# Patient Record
Sex: Male | Born: 1985
Health system: Southern US, Community
[De-identification: ages and names within clinical notes are randomized; demographics above are authoritative.]

## PROBLEM LIST (undated history)

## (undated) DIAGNOSIS — J020 Streptococcal pharyngitis: Secondary | ICD-10-CM

## (undated) DIAGNOSIS — J302 Other seasonal allergic rhinitis: Secondary | ICD-10-CM

## (undated) HISTORY — DX: Streptococcal pharyngitis: J02.0

## (undated) HISTORY — DX: Morbid (severe) obesity due to excess calories: E66.01

## (undated) HISTORY — DX: Other seasonal allergic rhinitis: J30.2

## (undated) HISTORY — PX: NO PAST SURGERIES: SHX2092

---

## 2011-04-28 ENCOUNTER — Emergency Department (INDEPENDENT_AMBULATORY_CARE_PROVIDER_SITE_OTHER)
Admission: EM | Admit: 2011-04-28 | Discharge: 2011-04-28 | Disposition: A | Discharge status: Home / Self Care | Payer: 59 | Source: Home / Self Care | Attending: Family Medicine | Admitting: Family Medicine

## 2011-04-28 ENCOUNTER — Encounter: Payer: Self-pay | Admitting: Emergency Medicine

## 2011-04-28 DIAGNOSIS — R6889 Other general symptoms and signs: Secondary | ICD-10-CM

## 2011-04-28 DIAGNOSIS — J111 Influenza due to unidentified influenza virus with other respiratory manifestations: Secondary | ICD-10-CM

## 2011-04-28 MED ORDER — ACETAMINOPHEN 325 MG PO TABS
ORAL_TABLET | ORAL | Status: AC
  Filled 2011-04-28: qty 3

## 2011-04-28 MED ORDER — OSELTAMIVIR PHOSPHATE 75 MG PO CAPS: 75.0000 mg | ORAL_CAPSULE | Freq: Two times a day (BID) | ORAL | Status: AC

## 2011-04-28 MED ORDER — ACETAMINOPHEN 325 MG PO TABS
975.0000 mg | ORAL_TABLET | Freq: Once | ORAL | Status: AC
  Administered 2011-04-28: 975 mg via ORAL

## 2011-04-28 NOTE — ED Provider Notes (Signed)
History     CSN: [id number]  Arrival date & time 04/28/11  1629   First MD Initiated Contact with Patient 04/28/11 1638      Chief Complaint  Patient presents with  . Influenza    (Consider location/radiation/quality/duration/timing/severity/associated sxs/prior treatment) HPI Comments: Victor Zimmerman presents for evaluation of headache, fever 101.2 F, body aches, rapid heart rate that started last evening.   Patient is a 26 y.o. male presenting with flu symptoms. The history is provided by the patient.  Influenza This is a new problem. The current episode started 12 to 24 hours ago. The problem occurs constantly. The problem has not changed since onset.Associated symptoms include headaches. Pertinent negatives include no chest pain. The symptoms are aggravated by nothing. The symptoms are relieved by nothing. He has tried acetaminophen for the symptoms.    History reviewed. No pertinent past medical history.  History reviewed. No pertinent past surgical history.  No family history on file.  History  Substance Use Topics  . Smoking status: Never Smoker   . Smokeless tobacco: Not on file  . Alcohol Use: No      Review of Systems  Constitutional: Positive for fever, chills and fatigue.  Eyes: Negative.   Respiratory: Negative.   Cardiovascular: Negative.  Negative for chest pain.  Gastrointestinal: Negative.   Genitourinary: Negative.   Musculoskeletal: Positive for myalgias.  Skin: Negative.   Neurological: Positive for headaches.    Allergies  Review of patient's allergies indicates no known allergies.  Home Medications   Current Outpatient Rx  Name Route Sig Dispense Refill  . OSELTAMIVIR PHOSPHATE 75 MG PO CAPS Oral Take 1 capsule (75 mg total) by mouth every 12 (twelve) hours. 10 capsule 0    BP 111/55  Pulse 130  Temp(Src) 100.6 F (38.1 C) (Oral)  Resp 24  SpO2 97%  Physical Exam  Nursing note and vitals reviewed. Constitutional: He is oriented to  person, place, and time. He appears well-developed and well-nourished.  HENT:  Head: Normocephalic and atraumatic.  Right Ear: Tympanic membrane is retracted.  Left Ear: Tympanic membrane is retracted.  Mouth/Throat: Uvula is midline, oropharynx is clear and moist and mucous membranes are normal.  Eyes: EOM are normal.  Neck: Normal range of motion.  Cardiovascular: Regular rhythm and normal heart sounds.  Tachycardia present.   No murmur heard. Pulmonary/Chest: Effort normal and breath sounds normal. He has no wheezes. He has no rhonchi.  Musculoskeletal: Normal range of motion.  Neurological: He is alert and oriented to person, place, and time.  Skin: Skin is warm and dry.  Psychiatric: His behavior is normal.    ED Course  Procedures (including critical care time)  Labs Reviewed - No data to display No results found.   1. Influenza-like illness       MDM  Fever and headache responded to acetaminophen 975 mg       Richardo Priest, MD 04/28/11 2105
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
## 2011-06-27 ENCOUNTER — Emergency Department (HOSPITAL_COMMUNITY)
Admission: EM | Admit: 2011-06-27 | Discharge: 2011-06-27 | Disposition: A | Discharge status: Home / Self Care | Payer: 59 | Source: Home / Self Care

## 2011-06-27 ENCOUNTER — Encounter (HOSPITAL_COMMUNITY): Payer: Self-pay | Admitting: *Deleted

## 2011-06-27 ENCOUNTER — Emergency Department (INDEPENDENT_AMBULATORY_CARE_PROVIDER_SITE_OTHER)
Admission: EM | Admit: 2011-06-27 | Discharge: 2011-06-27 | Disposition: A | Discharge status: Home / Self Care | Payer: 59 | Source: Home / Self Care

## 2011-06-27 DIAGNOSIS — T148XXA Other injury of unspecified body region, initial encounter: Secondary | ICD-10-CM

## 2011-06-27 MED ORDER — IBUPROFEN 800 MG PO TABS: 800.0000 mg | ORAL_TABLET | Freq: Three times a day (TID) | ORAL | Status: AC

## 2011-06-27 MED ORDER — CYCLOBENZAPRINE HCL 5 MG PO TABS: 5.0000 mg | ORAL_TABLET | Freq: Three times a day (TID) | ORAL | Status: AC | PRN
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
## 2011-06-27 NOTE — ED Notes (Signed)
Pt called x 2, in lobby

## 2011-06-30 NOTE — ED Provider Notes (Signed)
Medical screening examination/treatment/procedure(s) were performed by non-physician practitioner and as supervising physician I was immediately available for consultation/collaboration.  Alen Bleacher, MD 06/30/11 2051

## 2012-07-13 ENCOUNTER — Emergency Department (HOSPITAL_COMMUNITY): Payer: BC Managed Care – PPO

## 2012-07-13 ENCOUNTER — Emergency Department (HOSPITAL_COMMUNITY)
Admission: EM | Admit: 2012-07-13 | Discharge: 2012-07-13 | Disposition: A | Discharge status: Home / Self Care | Payer: BC Managed Care – PPO | Attending: Emergency Medicine | Admitting: Emergency Medicine

## 2012-07-13 ENCOUNTER — Encounter (HOSPITAL_COMMUNITY): Payer: Self-pay | Admitting: *Deleted

## 2012-07-13 DIAGNOSIS — Z79899 Other long term (current) drug therapy: Secondary | ICD-10-CM | POA: Insufficient documentation

## 2012-07-13 DIAGNOSIS — R1310 Dysphagia, unspecified: Secondary | ICD-10-CM | POA: Insufficient documentation

## 2012-07-13 DIAGNOSIS — R51 Headache: Secondary | ICD-10-CM | POA: Insufficient documentation

## 2012-07-13 DIAGNOSIS — M542 Cervicalgia: Secondary | ICD-10-CM | POA: Insufficient documentation

## 2012-07-13 DIAGNOSIS — R61 Generalized hyperhidrosis: Secondary | ICD-10-CM | POA: Insufficient documentation

## 2012-07-13 DIAGNOSIS — J020 Streptococcal pharyngitis: Secondary | ICD-10-CM | POA: Insufficient documentation

## 2012-07-13 DIAGNOSIS — R509 Fever, unspecified: Secondary | ICD-10-CM | POA: Insufficient documentation

## 2012-07-13 DIAGNOSIS — J0300 Acute streptococcal tonsillitis, unspecified: Secondary | ICD-10-CM

## 2012-07-13 DIAGNOSIS — R498 Other voice and resonance disorders: Secondary | ICD-10-CM | POA: Insufficient documentation

## 2012-07-13 MED ORDER — HYDROCODONE-ACETAMINOPHEN 7.5-500 MG/15ML PO SOLN: 15.0000 mL | Freq: Four times a day (QID) | ORAL | Status: DC | PRN

## 2012-07-13 MED ORDER — KETOROLAC TROMETHAMINE 30 MG/ML IJ SOLN
30.0000 mg | Freq: Once | INTRAMUSCULAR | Status: AC
  Administered 2012-07-13: 30 mg via INTRAVENOUS
  Filled 2012-07-13: qty 1

## 2012-07-13 MED ORDER — MORPHINE SULFATE 4 MG/ML IJ SOLN
6.0000 mg | Freq: Once | INTRAMUSCULAR | Status: AC
  Administered 2012-07-13: 6 mg via INTRAVENOUS
  Filled 2012-07-13: qty 2

## 2012-07-13 MED ORDER — DEXAMETHASONE SODIUM PHOSPHATE 4 MG/ML IJ SOLN
4.0000 mg | Freq: Once | INTRAMUSCULAR | Status: AC
  Administered 2012-07-13: 4 mg via INTRAVENOUS
  Filled 2012-07-13: qty 1

## 2012-07-13 MED ORDER — HYDROCODONE-ACETAMINOPHEN 7.5-325 MG/15ML PO SOLN
10.0000 mL | Freq: Once | ORAL | Status: AC
  Administered 2012-07-13: 10 mL via ORAL
  Filled 2012-07-13: qty 15

## 2012-07-13 MED ORDER — SODIUM CHLORIDE 0.9 % IV SOLN: Freq: Once | INTRAVENOUS | Status: DC

## 2012-07-13 MED ORDER — SODIUM CHLORIDE 0.9 % IV BOLUS (SEPSIS)
1000.0000 mL | Freq: Once | INTRAVENOUS | Status: AC
  Administered 2012-07-13: 1000 mL via INTRAVENOUS

## 2012-07-13 MED ORDER — PENICILLIN G BENZATHINE 1200000 UNIT/2ML IM SUSP
1.2000 10*6.[IU] | Freq: Once | INTRAMUSCULAR | Status: AC
  Administered 2012-07-13: 1.2 10*6.[IU] via INTRAMUSCULAR
  Filled 2012-07-13: qty 2

## 2012-07-13 MED ORDER — HYDROMORPHONE HCL PF 1 MG/ML IJ SOLN
1.0000 mg | Freq: Once | INTRAMUSCULAR | Status: AC
  Administered 2012-07-13: 1 mg via INTRAVENOUS
  Filled 2012-07-13: qty 1

## 2012-07-13 MED ORDER — PREDNISONE 20 MG PO TABS: 40.0000 mg | ORAL_TABLET | Freq: Every day | ORAL | Status: DC

## 2012-07-13 MED ORDER — IOHEXOL 300 MG/ML  SOLN
80.0000 mL | Freq: Once | INTRAMUSCULAR | Status: AC | PRN
  Administered 2012-07-13: 80 mL via INTRAVENOUS

## 2012-07-13 MED ORDER — ONDANSETRON HCL 4 MG/2ML IJ SOLN
4.0000 mg | Freq: Once | INTRAMUSCULAR | Status: AC
  Administered 2012-07-13: 4 mg via INTRAVENOUS
  Filled 2012-07-13: qty 2

## 2012-07-13 NOTE — ED Notes (Signed)
Patient presents with swollen throat x 3. Went to [facility] yesterday. Dx strep throat. Rx PCN. Within the past 24 hours patient developed more pain in throat, pain & difficulty swallowing and unable to speak (whispers). No fevers. Diaphoresis. Taking tylenol for pain.

## 2012-07-13 NOTE — ED Notes (Signed)
Patient transported to CT 

## 2012-07-13 NOTE — ED Notes (Signed)
ED PA at bedside

## 2012-07-13 NOTE — ED Notes (Signed)
The pt was diagnosed with strep throat at a clinic yesterday in [place].  He was given penicillin he has had  4 doses.  Today he has had more difficulty swallowing and when hew swallows he feels like knives in his throat.   He has no rash no difficulty breathing except when he swallows.  His tonsils are swollen but his airway is not obstructed

## 2012-07-13 NOTE — ED Provider Notes (Signed)
History     CSN: [id number]  Arrival date & time 07/13/12  1700   First MD Initiated Contact with Patient 07/13/12 1742      Chief Complaint  Patient presents with  . swollen tonsils     (Consider location/radiation/quality/duration/timing/severity/associated sxs/prior treatment) HPI Victor Zimmerman is a 27 y.o. male who presents with complaint of sore throat, swollen tonsils, unable to swallow solids or speak. States that pain for about 2 days, went to urgent care yesterday, had positive strep screen. Was started on penicillin. Had 4 doses so far. Today pain worsened. States unable to swallow solids, pain with drinking fluids. Unable to speak. States feels like throat is swelling. No difficulty breathing. Fevers at home, taking tylenol and motrin for this, last dose around 4 hrs ago. States also was given a lidocaine mouth wash, which is not helping.    History reviewed. No pertinent past medical history.  History reviewed. No pertinent past surgical history.  No family history on file.  History  Substance Use Topics  . Smoking status: Never Smoker   . Smokeless tobacco: Not on file  . Alcohol Use: No      Review of Systems  Constitutional: Positive for fever, chills and diaphoresis.  HENT: Positive for sore throat, trouble swallowing, neck pain and voice change. Negative for neck stiffness.   Respiratory: Negative.   Cardiovascular: Negative.   Neurological: Positive for headaches.  All other systems reviewed and are negative.    Allergies  Review of patient's allergies indicates no known allergies.  Home Medications   Current Outpatient Rx  Name  Route  Sig  Dispense  Refill  . acetaminophen (TYLENOL) 500 MG tablet   Oral   Take 1,000 mg by mouth every 6 (six) hours as needed for pain.         Marland Kitchen ibuprofen (ADVIL,MOTRIN) 200 MG tablet   Oral   Take 800 mg by mouth every 6 (six) hours as needed for pain.         Marland Kitchen penicillin v potassium (VEETID) 500 MG  tablet   Oral   Take 500 mg by mouth 2 (two) times daily.           BP 131/68  Pulse 103  Temp(Src) 98.4 F (36.9 C) (Oral)  Resp 18  SpO2 95%  Physical Exam  Nursing note and vitals reviewed. Constitutional: He appears well-developed and well-nourished.  Uncomfortable appearing, lip talking  HENT:  Head: Normocephalic.  Right Ear: Tympanic membrane, external ear and ear canal normal.  Left Ear: Tympanic membrane, external ear and ear canal normal.  Nose: Nose normal.  Mouth/Throat: Uvula is midline and mucous membranes are normal. Normal dentition. Oropharyngeal exudate, posterior oropharyngeal edema and posterior oropharyngeal erythema present. No tonsillar abscesses.  Tonsils enlarged, exudate present  Eyes: Conjunctivae are normal.  Neck: Neck supple.  Cardiovascular: Normal rate, regular rhythm and normal heart sounds.   Pulmonary/Chest: Effort normal and breath sounds normal. No respiratory distress. He has no wheezes. He has no rales.  No stridor  Lymphadenopathy:    He has no cervical adenopathy.  Neurological: He is alert.  Skin: He is diaphoretic.    ED Course  Procedures (including critical care time)  No results found for this or any previous visit. Ct Soft Tissue Neck W Contrast  07/13/2012  *RADIOLOGY REPORT*  Clinical Data: Sore throat and neck pain.  CT NECK WITH CONTRAST  Technique:  Multidetector CT imaging of the neck was performed with intravenous contrast.  Contrast: 80mL OMNIPAQUE IOHEXOL 300 MG/ML  SOLN  Comparison: None.  Findings: The visualized portion of the brain is unremarkable.  The major vascular structures are normal.  The skull base is unremarkable.  The visualized paranasal sinuses and mastoid air cells are clear.  The middle ear cavities are clear.  The parotid and submandibular glands are normal.  There are bilateral enlarged neck nodes likely due to inflammation.  There is marked inflammation and swelling of the tonsils and adenoids.  I  do not see a discrete abscess. Multiple calcifications are noted in the tonsillar regions likely due to prior inflammatory disease.  The epiglottis and aryepiglottic folds are normal.  No abnormal prevertebral or retropharyngeal soft tissue swelling or air collections.  The thyroid gland is normal.  The major vascular structures are normal.  The tongue base and floor of mouth are unremarkable.  The lung apices are clear.  IMPRESSION:  1.  Severe tonsillitis but no discrete tonsillar abscess. 2.  Inflammatory neck adenopathy.   Original Report Authenticated By: Rudie Meyer, M.D.      1. Streptococcal tonsillitis       MDM  Pt with bilaterally enlarged tonsils, pain with talking, muffled voice. CT obtained of the soft tissue neck, showing severe tonsillitis. Pt was giving bicillin shot in ED. Decadron 4mg  IV. He was given fluids, morphin and dilaudid for pain. Pt is tolerating po fluids. No respiratory problems. Pt will be d/c home with pain medications and follow up.   Filed Vitals:   07/13/12 1830 07/13/12 1930 07/13/12 2000 07/13/12 2030  BP: 135/96 126/77 124/73 135/80  Pulse: 91 92 82 87  Temp:      TempSrc:      Resp:      SpO2: 97% 97% 98% 94%           Myriam Jacobson Gordana Kewley, PA-C 07/14/12 0037

## 2012-07-14 NOTE — ED Provider Notes (Signed)
Medical screening examination/treatment/procedure(s) were performed by non-physician practitioner and as supervising physician I was immediately available for consultation/collaboration.  Tunisia Landgrebe, MD 07/14/12 0820 

## 2013-03-21 ENCOUNTER — Emergency Department (HOSPITAL_COMMUNITY)
Admission: EM | Admit: 2013-03-21 | Discharge: 2013-03-21 | Disposition: A | Discharge status: Home / Self Care | Payer: BC Managed Care – PPO | Source: Home / Self Care | Attending: Family Medicine | Admitting: Family Medicine

## 2013-03-21 ENCOUNTER — Encounter (HOSPITAL_COMMUNITY): Payer: Self-pay | Admitting: Emergency Medicine

## 2013-03-21 DIAGNOSIS — J329 Chronic sinusitis, unspecified: Secondary | ICD-10-CM

## 2013-03-21 MED ORDER — PREDNISONE 50 MG PO TABS: 50.0000 mg | ORAL_TABLET | Freq: Every day | ORAL | Status: DC

## 2013-03-21 MED ORDER — IPRATROPIUM BROMIDE 0.06 % NA SOLN: 2.0000 | Freq: Four times a day (QID) | NASAL | Status: DC

## 2013-03-21 MED ORDER — AZITHROMYCIN 250 MG PO TABS: 250.0000 mg | ORAL_TABLET | Freq: Every day | ORAL | Status: DC

## 2013-03-21 NOTE — ED Provider Notes (Signed)
Victor Zimmerman is a 27 y.o. male who presents to Urgent Care today for 9 days of facial pain nasal discharge chills nasal congestion and difficulty sleeping. He's tried Tylenol ibuprofen and Mucinex which has helped only a little. He notes that warm showers allow his nose to drain which helps. He has a history of frequent sinus infections. His current symptoms are consistent with sinus infection. He feels well with no nausea vomiting or diarrhea.  In the past azithromycin typically works well for his sinus infections.    History reviewed. No pertinent past medical history. History  Substance Use Topics  . Smoking status: Never Smoker   . Smokeless tobacco: Not on file  . Alcohol Use: No   ROS as above Medications reviewed. No current facility-administered medications for this encounter.   Current Outpatient Prescriptions  Medication Sig Dispense Refill  . acetaminophen (TYLENOL) 500 MG tablet Take 1,000 mg by mouth every 6 (six) hours as needed for pain.      Marland Kitchen azithromycin (ZITHROMAX) 250 MG tablet Take 1 tablet (250 mg total) by mouth daily. Take first 2 tablets together, then 1 every day until finished.  6 tablet  0  . ibuprofen (ADVIL,MOTRIN) 200 MG tablet Take 800 mg by mouth every 6 (six) hours as needed for pain.      Marland Kitchen ipratropium (ATROVENT) 0.06 % nasal spray Place 2 sprays into both nostrils 4 (four) times daily.  15 mL  1  . predniSONE (DELTASONE) 50 MG tablet Take 1 tablet (50 mg total) by mouth daily.  5 tablet  0    Exam:  BP 118/79  Pulse 76  Temp(Src) 98 F (36.7 C) (Oral)  Resp 20  SpO2 100% Gen: Well NAD HEENT: EOMI,  MMM tender palpation bilateral maxillary sinus. Well-appearing tympanic membranes bilaterally. Posterior pharynx mildly erythematous Lungs: Normal work of breathing. CTABL Heart: RRR no MRG Abd: NABS, Soft. NT, ND Exts: Non edematous BL  LE, warm and well perfused.    Assessment and Plan: 27 y.o. male with sinusitis. Plan to treat with  azithromycin and Atrovent nasal spray and prednisone. Followup with [facility] ear nose and throat as he has frequent sinus infections. Discussed warning signs or symptoms. Please see discharge instructions. Patient expresses understanding.      Rodolph Bong, MD 03/21/13 [phone]

## 2013-03-21 NOTE — ED Notes (Signed)
C/o sinus pressure and pain. Chills.  Jaw pain and pain in ears.  States "feels like my head is going to explode".   Pt has tried mucinex with no relief.  Denies fever, n/v/d

## 2013-05-26 ENCOUNTER — Ambulatory Visit (INDEPENDENT_AMBULATORY_CARE_PROVIDER_SITE_OTHER): Payer: BC Managed Care – PPO | Admitting: Family Medicine

## 2013-05-26 ENCOUNTER — Encounter: Payer: Self-pay | Admitting: Family Medicine

## 2013-05-26 VITALS — BP 112/76 | HR 74 | Temp 98.2°F | Ht 73.0 in | Wt 318.8 lb

## 2013-05-26 DIAGNOSIS — Z0000 Encounter for general adult medical examination without abnormal findings: Secondary | ICD-10-CM | POA: Insufficient documentation

## 2013-05-26 DIAGNOSIS — E669 Obesity, unspecified: Secondary | ICD-10-CM | POA: Insufficient documentation

## 2013-05-26 DIAGNOSIS — Z0001 Encounter for general adult medical examination with abnormal findings: Secondary | ICD-10-CM | POA: Insufficient documentation

## 2013-05-26 DIAGNOSIS — Z23 Encounter for immunization: Secondary | ICD-10-CM

## 2013-05-26 NOTE — Addendum Note (Signed)
Addended by: Josph MachoANCE, Khalel Alms A on: 05/26/2013 10:50 AM   Modules accepted: Orders

## 2013-05-26 NOTE — Progress Notes (Signed)
Subjective:    Patient ID: Victor Zimmerman, male    DOB: 07/26/1985, 28 y.o.   MRN: [id number]  HPI CC: CPE  New pt to establish, would like CPE.  Upcoming sinus surgery - prone to sinus infections (5-6 in last year).  Sees Dr. Jenne PaneBates. Body mass index is 42.06 kg/(m^2).  Preventative: No recent blood work. Flu shot - declines Tetanus - did receive 2007.  Would like Tdap today.  Lives with wife, 4 children, expecting 5th Occupation: Arts administratorbay seller, starting own business Edu: HS Activity: no regular exercise Diet: some water, vegetables daily  Medications and allergies reviewed and updated in chart.  Past histories reviewed and updated if relevant as below. There are no active problems to display for this patient.  Past Medical History  Diagnosis Date  . Seasonal allergic rhinitis     and dust  . Obese    Past Surgical History  Procedure Laterality Date  . No past surgeries     History  Substance Use Topics  . Smoking status: Never Smoker   . Smokeless tobacco: Never Used  . Alcohol Use: No   Family History  Problem Relation Age of Onset  . Diabetes Other     maternal side  . Hypertension Mother   . Cancer Maternal Uncle     brain  . Cancer Maternal Uncle     unknown  . Cancer Maternal Aunt     unsure  . CAD Neg Hx    No Known Allergies Current Outpatient Prescriptions on File Prior to Visit  Medication Sig Dispense Refill  . acetaminophen (TYLENOL) 500 MG tablet Take 1,000 mg by mouth every 6 (six) hours as needed for pain.      Marland Kitchen. ibuprofen (ADVIL,MOTRIN) 200 MG tablet Take 800 mg by mouth every 6 (six) hours as needed for pain.       No current facility-administered medications on file prior to visit.     Review of Systems  Constitutional: Negative for fever, chills, activity change, appetite change, fatigue and unexpected weight change.  HENT: Negative for hearing loss.   Eyes: Negative for visual disturbance.  Respiratory: Negative for cough, chest  tightness, shortness of breath and wheezing.   Cardiovascular: Negative for chest pain, palpitations and leg swelling.  Gastrointestinal: Negative for nausea, vomiting, abdominal pain, diarrhea, constipation, blood in stool and abdominal distention.  Genitourinary: Negative for hematuria and difficulty urinating.  Musculoskeletal: Negative for arthralgias, myalgias and neck pain.  Skin: Negative for rash.  Neurological: Negative for dizziness, seizures, syncope and headaches.  Hematological: Negative for adenopathy. Does not bruise/bleed easily.  Psychiatric/Behavioral: Negative for dysphoric mood. The patient is not nervous/anxious.        Objective:   Physical Exam  Nursing note and vitals reviewed. Constitutional: He is oriented to person, place, and time. He appears well-developed and well-nourished. No distress.  HENT:  Head: Normocephalic and atraumatic.  Right Ear: Hearing, tympanic membrane, external ear and ear canal normal.  Left Ear: Hearing, tympanic membrane, external ear and ear canal normal.  Nose: Nose normal.  Mouth/Throat: Oropharynx is clear and moist. No oropharyngeal exudate.  Eyes: Conjunctivae and EOM are normal. Pupils are equal, round, and reactive to light. No scleral icterus.  Neck: Normal range of motion. Neck supple. No thyromegaly present.  Cardiovascular: Normal rate, regular rhythm, normal heart sounds and intact distal pulses.   No murmur heard. Pulses:      Radial pulses are 2+ on the right side, and 2+ on  the left side.  Pulmonary/Chest: Effort normal and breath sounds normal. No respiratory distress. He has no wheezes. He has no rales.  Abdominal: Soft. Bowel sounds are normal. He exhibits no distension and no mass. There is no tenderness. There is no rebound and no guarding.  Musculoskeletal: Normal range of motion. He exhibits no edema.  Lymphadenopathy:    He has no cervical adenopathy.  Neurological: He is alert and oriented to person, place,  and time.  CN grossly intact, station and gait intact  Skin: Skin is warm and dry. No rash noted.  Psychiatric: He has a normal mood and affect. His behavior is normal. Judgment and thought content normal.       Assessment & Plan:

## 2013-05-26 NOTE — Assessment & Plan Note (Signed)
Pt planning on participating in weight loss program in upcoming months. Body mass index is 42.06 kg/(m^2).

## 2013-05-26 NOTE — Assessment & Plan Note (Signed)
Preventative protocols reviewed and updated unless pt declined. Discussed healthy diet and lifestyle.  

## 2013-05-26 NOTE — Patient Instructions (Addendum)
Good to meet you today, call us with questions. Return at your convenience fasting for blood work. Tdap today. Return as needed or in 2 years for next physical.

## 2013-06-19 ENCOUNTER — Other Ambulatory Visit: Payer: BC Managed Care – PPO

## 2013-06-25 ENCOUNTER — Ambulatory Visit (INDEPENDENT_AMBULATORY_CARE_PROVIDER_SITE_OTHER): Payer: BC Managed Care – PPO | Admitting: Family Medicine

## 2013-06-25 ENCOUNTER — Telehealth: Payer: Self-pay

## 2013-06-25 ENCOUNTER — Other Ambulatory Visit (INDEPENDENT_AMBULATORY_CARE_PROVIDER_SITE_OTHER): Payer: BC Managed Care – PPO

## 2013-06-25 ENCOUNTER — Encounter: Payer: Self-pay | Admitting: Family Medicine

## 2013-06-25 VITALS — BP 108/74 | HR 66 | Temp 98.5°F | Wt 315.5 lb

## 2013-06-25 DIAGNOSIS — J029 Acute pharyngitis, unspecified: Secondary | ICD-10-CM

## 2013-06-25 DIAGNOSIS — E669 Obesity, unspecified: Secondary | ICD-10-CM

## 2013-06-25 LAB — BASIC METABOLIC PANEL
BUN: 12 mg/dL (ref 6–23)
CALCIUM: 9.3 mg/dL (ref 8.4–10.5)
CO2: 28 mEq/L (ref 19–32)
Chloride: 107 mEq/L (ref 96–112)
Creatinine, Ser: 1.1 mg/dL (ref 0.4–1.5)
GFR: 86.38 mL/min (ref >60.00)
Glucose, Bld: 88 mg/dL (ref 70–99)
Potassium: 4.5 mEq/L (ref 3.5–5.1)
SODIUM: 139 meq/L (ref 135–145)

## 2013-06-25 LAB — LIPID PANEL
Cholesterol: 149 mg/dL (ref 0–200)
HDL: 29.3 mg/dL — AB (ref >39.00)
LDL Cholesterol: 98 mg/dL (ref 0–99)
TRIGLYCERIDES: 110 mg/dL (ref 0.0–149.0)
Total CHOL/HDL Ratio: 5
VLDL: 22 mg/dL (ref 0.0–40.0)

## 2013-06-25 LAB — POCT RAPID STREP A (OFFICE): RAPID STREP A SCREEN: POSITIVE — AB

## 2013-06-25 MED ORDER — AMOXICILLIN 875 MG PO TABS: 875.0000 mg | ORAL_TABLET | Freq: Two times a day (BID) | ORAL | Status: DC

## 2013-06-25 NOTE — Patient Instructions (Addendum)
You have pharyngitis - and strep test returned positive. Treat with 10 day course of amoxicillin. Push fluids and plenty of rest. May use ibuprofen 400-600mg  with food for throat inflammation. Salt water gargles. Suck on cold things like popsicles or warm things like herbal teas (whichever soothes the throat better). Return if fever >101.5, worsening pain, or trouble opening/closing mouth, or hoarse voice. Good to see you today, call clinic with questions.

## 2013-06-25 NOTE — Addendum Note (Signed)
Addended by: Shon MilletWATLINGTON, SHAPALE M on: 06/25/2013 10:31 AM   Modules accepted: Orders

## 2013-06-25 NOTE — Assessment & Plan Note (Addendum)
Anticipate viral.  Given hx prior severe strep infection that presented similarly, check RST today. Faintly positive. Will treat with amoxicillin course.  Update if sxs persist. Discussed universal precautions and covering mouth around newborn at home.

## 2013-06-25 NOTE — Addendum Note (Signed)
Addended by: Eustaquio BoydenGUTIERREZ, Kaamil Morefield on: 06/25/2013 10:34 AM   Modules accepted: Orders

## 2013-06-25 NOTE — Progress Notes (Addendum)
   BP 108/74  Pulse 66  Temp(Src) 98.5 F (36.9 C) (Oral)  Wt 315 lb 8 oz (143.11 kg)  SpO2 97%   CC: knot in throat Subjective:    Patient ID: Victor Zimmerman, male    DOB: 04/18/1986, 28 y.o.   MRN: [id number]  HPI: Victor QuinceMichael Manson is a 28 y.o. male presenting on 06/25/2013 with Mass   Yesterday started noticing knot in throat more noticeable with swallowing.  Seems more on left side.  Headache last night resolved with excedrin.  More fatigued yesterday as well.  No sore throat or scratchy throat.  No congestion or rhinorrhea, ear or tooth pain.  No coughing.  no fevers. No sick contacts at home.  Newborn premie at home.    Restarted allergy meds yesterday (allegra and dymista) for ?allergic related due to weather change..didn't seem to help  H/o severe strep throat infection with throat swelling that led to ER visit last year.  Relevant past medical, surgical, family and social history reviewed and updated as indicated.  Allergies and medications reviewed and updated. Current Outpatient Prescriptions on File Prior to Visit  Medication Sig  . acetaminophen (TYLENOL) 500 MG tablet Take 1,000 mg by mouth every 6 (six) hours as needed for pain.  . fexofenadine (ALLEGRA) 180 MG tablet Take 180 mg by mouth daily.  Marland Kitchen. ibuprofen (ADVIL,MOTRIN) 200 MG tablet Take 800 mg by mouth every 6 (six) hours as needed for pain.   No current facility-administered medications on file prior to visit.    Review of Systems Per HPI unless specifically indicated above    Objective:    BP 108/74  Pulse 66  Temp(Src) 98.5 F (36.9 C) (Oral)  Wt 315 lb 8 oz (143.11 kg)  SpO2 97%  Physical Exam  Nursing note and vitals reviewed. Constitutional: He appears well-developed and well-nourished. No distress.  HENT:  Head: Normocephalic and atraumatic.  Right Ear: Hearing, tympanic membrane, external ear and ear canal normal.  Left Ear: Hearing, tympanic membrane, external ear and ear canal normal.    Nose: Nose normal. No mucosal edema or rhinorrhea. Right sinus exhibits no maxillary sinus tenderness and no frontal sinus tenderness. Left sinus exhibits no maxillary sinus tenderness and no frontal sinus tenderness.  Mouth/Throat: Uvula is midline and mucous membranes are normal. Posterior oropharyngeal edema present. No oropharyngeal exudate, posterior oropharyngeal erythema or tonsillar abscesses.  Some post oropharynx drainage/raw  Eyes: Conjunctivae and EOM are normal. Pupils are equal, round, and reactive to light. No scleral icterus.  Neck: Normal range of motion. Neck supple. No thyromegaly present.  Lymphadenopathy:    He has no cervical adenopathy.   Results for orders placed in visit on 06/25/13  POCT RAPID STREP A (OFFICE)      Result Value Ref Range   Rapid Strep A Screen Positive (*) Negative      Assessment & Plan:   Problem List Items Addressed This Visit   Acute pharyngitis - Primary     Anticipate viral.  Given hx prior severe strep infection that presented similarly, check RST today. Faintly positive. Will treat with amoxicillin course.  Update if sxs persist. Discussed universal precautions and covering mouth around newborn at home.     Other Visit Diagnoses   Sore throat        Relevant Orders       Rapid Strep A (Completed)        Follow up plan: Return if symptoms worsen or fail to improve.

## 2013-06-25 NOTE — Progress Notes (Signed)
Pre visit review using our clinic review tool, if applicable. No additional management support is needed unless otherwise documented below in the visit note. 

## 2013-06-25 NOTE — Telephone Encounter (Signed)
Pt started 06/24/13 with knot in throat and hurts to swallow; pt feels fatigued and ? Swelling in throat. No fever. Pt had similar symptoms last year and was seen in ED due to difficulty breathing because throat was swelling. Pt thinks he had strep also. Dr Reece AgarG will see now.

## 2013-06-29 ENCOUNTER — Encounter: Payer: Self-pay | Admitting: *Deleted

## 2013-11-03 ENCOUNTER — Ambulatory Visit (INDEPENDENT_AMBULATORY_CARE_PROVIDER_SITE_OTHER): Payer: BC Managed Care – PPO | Admitting: Family Medicine

## 2013-11-03 ENCOUNTER — Encounter: Payer: Self-pay | Admitting: Family Medicine

## 2013-11-03 VITALS — BP 110/70 | HR 68 | Temp 98.0°F | Wt 307.5 lb

## 2013-11-03 DIAGNOSIS — F900 Attention-deficit hyperactivity disorder, predominantly inattentive type: Secondary | ICD-10-CM | POA: Insufficient documentation

## 2013-11-03 DIAGNOSIS — R41840 Attention and concentration deficit: Secondary | ICD-10-CM

## 2013-11-03 MED ORDER — ATOMOXETINE HCL 40 MG PO CAPS: 40.0000 mg | ORAL_CAPSULE | Freq: Every day | ORAL | Status: DC

## 2013-11-03 NOTE — Patient Instructions (Signed)
Trial of stattera - start 40mg  daily Give me an update in 2-3 weeks with how you're tolerating medication  Atomoxetine capsules What is this medicine? ATOMOXETINE (AT oh mox e teen) is used to treat attention deficit/hyperactivity disorder, also known as ADHD. It is not a stimulant like other drugs for ADHD. This drug can improve attention span, concentration, and emotional control. It can also reduce restless or overactive behavior. This medicine may be used for other purposes; ask your health care provider or pharmacist if you have questions. COMMON BRAND NAME(S): Strattera What should I tell my health care provider before I take this medicine? They need to know if you have any of these conditions: -glaucoma -high or low blood pressure -history of stroke -irregular heartbeat or other cardiac disease -liver disease -mania or bipolar disorder -pheochromocytoma -suicidal thoughts -an unusual or allergic reaction to atomoxetine, other medicines, foods, dyes, or preservatives -pregnant or trying to get pregnant -breast-feeding How should I use this medicine? Take this medicine by mouth with a glass of water. Follow the directions on the prescription label. You can take it with or without food. If it upsets your stomach, take it with food. If you have difficulty sleeping and you take more than 1 dose per day, take your last dose before 6 PM. Take your medicine at regular intervals. Do not take it more often than directed. Do not stop taking except on your doctor's advice. A special MedGuide will be given to you by the pharmacist with each prescription and refill. Be sure to read this information carefully each time. Talk to your pediatrician regarding the use of this medicine in children. While this drug may be prescribed for children as young as 6 years for selected conditions, precautions do apply. Overdosage: If you think you have taken too much of this medicine contact a poison control  center or emergency room at once. NOTE: This medicine is only for you. Do not share this medicine with others. What if I miss a dose? If you miss a dose, take it as soon as you can. If it is almost time for your next dose, take only that dose. Do not take double or extra doses. What may interact with this medicine? Do not take this medicine with any of the following medications: -MAOIs like Carbex, Eldepryl, Marplan, Nardil, and Parnate -reboxetine This medicine may also interact with the following medications: -albuterol -certain medicines for blood pressure, heart disease, irregular heart beat -certain medicines for depression, anxiety, or psychotic disturbances -cold or allergy medicines -isoproterenol -medicines that increase blood pressure like dopamine, dobutamine, or ephedrine -stimulant medicines for attention disorders, weight loss, or to stay awake -terbutaline This list may not describe all possible interactions. Give your health care provider a list of all the medicines, herbs, non-prescription drugs, or dietary supplements you use. Also tell them if you smoke, drink alcohol, or use illegal drugs. Some items may interact with your medicine. What should I watch for while using this medicine? It may take a week or more for this medicine to take effect. This is why it is very important to continue taking the medicine and not miss any doses. If you have been taking this medicine regularly for some time, do not suddenly stop taking it. Ask your doctor or health care professional for advice. Rarely, this medicine may increase thoughts of suicide or suicide attempts in children and teenagers. Call your child's health care professional right away if your child or teenager has new or  increased thoughts of suicide or has changes in mood or behavior like becoming irritable or anxious. Regularly monitor your child for these behavioral changes. For males, contact you doctor or health care  professional right away if you have an erection that lasts longer than 4 hours or if it becomes painful. This may be a sign of serious problem and must be treated right away to prevent permanent damage. You may get drowsy or dizzy. Do not drive, use machinery, or do anything that needs mental alertness until you know how this medicine affects you. Do not stand or sit up quickly, especially if you are an older patient. This reduces the risk of dizzy or fainting spells. Alcohol can make you more drowsy and dizzy. Avoid alcoholic drinks. Do not treat yourself for coughs, colds or allergies without asking your doctor or health care professional for advice. Some ingredients can increase possible side effects. Your mouth may get dry. Chewing sugarless gum or sucking hard candy, and drinking plenty of water will help. What side effects may I notice from receiving this medicine? Side effects that you should report to your doctor or health care professional as soon as possible: -allergic reactions like skin rash, itching or hives, swelling of the face, lips, or tongue -breathing problems -chest pain -dark urine -fast, irregular heartbeat -general ill feeling or flu-like symptoms -high blood pressure -males: prolonged or painful erection -stomach pain or tenderness -trouble passing urine or change in the amount of urine -vomiting -weight loss -yellowing of the eyes or skin Side effects that usually do not require medical attention (report to your doctor or health care professional if they continue or are bothersome): -change in sex drive or performance -constipation or diarrhea -headache -loss of appetite -menstrual period irregularities -nausea -stomach upset This list may not describe all possible side effects. Call your doctor for medical advice about side effects. You may report side effects to FDA at 1-800-FDA-1088. Where should I keep my medicine? Keep out of the reach of children. Store at  room temperature between 15 and 30 degrees C (59 and 86 degrees F). Throw away any unused medication after the expiration date. NOTE: This sheet is a summary. It may not cover all possible information. If you have questions about this medicine, talk to your doctor, pharmacist, or health care provider.  2015, Elsevier/Gold Standard. (2012-12-29 14:33:10)

## 2013-11-03 NOTE — Progress Notes (Signed)
Pre visit review using our clinic review tool, if applicable. No additional management support is needed unless otherwise documented below in the visit note. 

## 2013-11-03 NOTE — Progress Notes (Addendum)
   BP 110/70  Pulse 68  Temp(Src) 98 F (36.7 C) (Oral)  Wt 307 lb 8 oz (139.481 kg)   CC: "i've got ADD"  Subjective:    Patient ID: Victor Zimmerman, male    DOB: 03/04/1986, 28 y.o.   MRN: [id number]  HPI: Victor Zimmerman is a 28 y.o. male presenting on 11/03/2013 for Can't focus   Presents with wife who corroborates story  Longstanding trouble with concentration. Noticed trouble starting in high school - trouble concentrating. Got Bs and Cs. More trouble noted when started working. Now has quit his job and started self-employed job - trouble focusing on getting packages out. Spends longer time on tasks that previously did not take him that long.  Does plan errands - plans route out.  Finds trouble focusing with online work.  Doesn't grocery shop.   Sells on [employer] full time.  5 children at home, youngest is infant premie daughter.  Denies hyperactivity, fidgeting, interruption/impulsivity. Denies depression/anhdeonia, sadness, anxiety.  Lives with wife, 5 children Occupation: Arts administratorbay seller, starting own business Edu: HS Activity: no regular exercise Diet: some water, vegetables daily  Relevant past medical, surgical, family and social history reviewed and updated as indicated.  Allergies and medications reviewed and updated. Current Outpatient Prescriptions on File Prior to Visit  Medication Sig  . acetaminophen (TYLENOL) 500 MG tablet Take 1,000 mg by mouth every 6 (six) hours as needed for pain.  . Azelastine-Fluticasone (DYMISTA) 137-50 MCG/ACT SUSP Place 1 spray into the nose daily as needed.  . fexofenadine (ALLEGRA) 180 MG tablet Take 180 mg by mouth daily.  Marland Kitchen. ibuprofen (ADVIL,MOTRIN) 200 MG tablet Take 800 mg by mouth every 6 (six) hours as needed for pain.   No current facility-administered medications on file prior to visit.    Review of Systems Per HPI unless specifically indicated above    Objective:    BP 110/70  Pulse 68  Temp(Src) 98 F (36.7 C)  (Oral)  Wt 307 lb 8 oz (139.481 kg)  Physical Exam  Nursing note and vitals reviewed. Constitutional: He appears well-developed and well-nourished. No distress.  Psychiatric: He has a normal mood and affect. His behavior is normal. Judgment and thought content normal.       Assessment & Plan:   Problem List Items Addressed This Visit   Attention and concentration deficit - Primary     Suspicious for ADD - discussed different treatment options including pharmacotherapy as well as counseling and learning new work strategies. Will do trial of nonstimulant strattera 40mg  daily. Discussed common side effects and provided with handout. If not improved on med or med not tolerated, discussed formal testing and if consistent with ADD, consideration of stimulant medication. Return in 4-6 wks for f/u. Positive adult ADHD screen today (4 points)        Follow up plan: Return in about 1 month (around 12/04/2013), or if symptoms worsen or fail to improve, for follow up visit.

## 2013-11-03 NOTE — Assessment & Plan Note (Addendum)
Suspicious for ADD - discussed different treatment options including pharmacotherapy as well as counseling and learning new work strategies. Will do trial of nonstimulant strattera 40mg  daily. Discussed common side effects and provided with handout. If not improved on med or med not tolerated, discussed formal testing and if consistent with ADD, consideration of stimulant medication. Return in 4-6 wks for f/u. Positive adult ADHD screen today (4 points)

## 2013-11-04 ENCOUNTER — Telehealth: Payer: Self-pay

## 2013-11-04 MED ORDER — BUPROPION HCL ER (XL) 150 MG PO TB24: 150.0000 mg | ORAL_TABLET | Freq: Every day | ORAL | Status: DC

## 2013-11-04 NOTE — Telephone Encounter (Signed)
Spoke with pharmacist re pricing of nonstimulant options  Intuniv vs wellbutrin both more affordable. Spoke with patient - requests trial of wellbutrin XL. Sent to pharmacy. Discussed mood medication that may help with concentration. Pt willing to try this.

## 2013-11-04 NOTE — Telephone Encounter (Signed)
Pt left v/m; Strattera 40 mg was too expensive; cost to pt was $270.00. Pt wants to know if alternative generic med pt could be prescribed. [facility] [address]. Pt request cb.

## 2013-12-04 ENCOUNTER — Ambulatory Visit: Payer: BC Managed Care – PPO | Admitting: Family Medicine

## 2013-12-04 DIAGNOSIS — Z0289 Encounter for other administrative examinations: Secondary | ICD-10-CM

## 2014-02-22 ENCOUNTER — Ambulatory Visit (INDEPENDENT_AMBULATORY_CARE_PROVIDER_SITE_OTHER): Payer: BC Managed Care – PPO | Admitting: Internal Medicine

## 2014-02-22 ENCOUNTER — Encounter: Payer: Self-pay | Admitting: Internal Medicine

## 2014-02-22 VITALS — BP 118/82 | HR 69 | Temp 98.2°F | Wt 306.0 lb

## 2014-02-22 DIAGNOSIS — J029 Acute pharyngitis, unspecified: Secondary | ICD-10-CM

## 2014-02-22 DIAGNOSIS — J302 Other seasonal allergic rhinitis: Secondary | ICD-10-CM

## 2014-02-22 DIAGNOSIS — J020 Streptococcal pharyngitis: Secondary | ICD-10-CM

## 2014-02-22 LAB — POCT RAPID STREP A (OFFICE): Rapid Strep A Screen: POSITIVE — AB

## 2014-02-22 MED ORDER — AZELASTINE-FLUTICASONE 137-50 MCG/ACT NA SUSP: 1.0000 | Freq: Every day | NASAL | Status: DC | PRN

## 2014-02-22 MED ORDER — AMOXICILLIN 875 MG PO TABS: 875.0000 mg | ORAL_TABLET | Freq: Two times a day (BID) | ORAL | Status: DC

## 2014-02-22 NOTE — Addendum Note (Signed)
Addended by: Roena MaladyEVONTENNO, Crosby Bevan Y on: 02/22/2014 04:46 PM   Modules accepted: Orders

## 2014-02-22 NOTE — Progress Notes (Signed)
Subjective:    Patient ID: Victor Zimmerman, male    DOB: 09/11/1985, [age] y.o.   MRN: [id number]  HPI  Pt presents to the clinic today with c/o sore throat, difficulty swallowing, fever and chills. He reports this started 3 days ago. He denies any other URI symptoms. He does have a history of strep infections. He has not had sick contacts that he is aware of. He does have a history of seasonal allergies. He takes LandAllegra and Dymista for them. He does request a refill of his Dymista today.  Review of Systems      Past Medical History  Diagnosis Date  . Seasonal allergic rhinitis     and dust  . Obese     Current Outpatient Prescriptions  Medication Sig Dispense Refill  . acetaminophen (TYLENOL) 500 MG tablet Take 1,000 mg by mouth every 6 (six) hours as needed for pain.    . Azelastine-Fluticasone (DYMISTA) 137-50 MCG/ACT SUSP Place 1 spray into the nose daily as needed.    Marland Kitchen. buPROPion (WELLBUTRIN XL) 150 MG 24 hr tablet Take 1 tablet (150 mg total) by mouth daily. 30 tablet 3  . fexofenadine (ALLEGRA) 180 MG tablet Take 180 mg by mouth daily.    Marland Kitchen. ibuprofen (ADVIL,MOTRIN) 200 MG tablet Take 800 mg by mouth every 6 (six) hours as needed for pain.     No current facility-administered medications for this visit.    No Known Allergies  Family History  Problem Relation Age of Onset  . Diabetes Other     maternal side  . Hypertension Mother   . Cancer Maternal Uncle     brain  . Cancer Maternal Uncle     unknown  . Cancer Maternal Aunt     unsure  . CAD Neg Hx     History   Social History  . Marital Status: Married    Spouse Name: N/A    Number of Children: N/A  . Years of Education: N/A   Occupational History  . Not on file.   Social History Main Topics  . Smoking status: Never Smoker   . Smokeless tobacco: Never Used  . Alcohol Use: No  . Drug Use: No  . Sexual Activity: Yes    Birth Control/ Protection: Condom   Other Topics Concern  . Not on file    Social History Narrative   Lives with wife, 5 children   Occupation: Arts administratorbay seller, starting own business   Edu: HS   Activity: no regular exercise   Diet: some water, vegetables daily     Constitutional: Pt reports chills and fever. Denies malaise, fatigue, headache or abrupt weight changes.  HEENT: Pt reports sore throat. Denies eye pain, eye redness, ear pain, ringing in the ears, wax buildup, runny nose, nasal congestion, bloody nose. Respiratory: Denies difficulty breathing, shortness of breath, cough or sputum production.   Cardiovascular: Denies chest pain, chest tightness, palpitations or swelling in the hands or feet.    No other specific complaints in a complete review of systems (except as listed in HPI above).  Objective:   Physical Exam   BP 118/82 mmHg  Pulse 69  Temp(Src) 98.2 F (36.8 C) (Oral)  Wt 306 lb (138.801 kg)  SpO2 98% Wt Readings from Last 3 Encounters:  02/22/14 306 lb (138.801 kg)  11/03/13 307 lb 8 oz (139.481 kg)  06/25/13 315 lb 8 oz (143.11 kg)    General: Appears his stated age, obese but well developed, well  nourished in NAD. HEENT: Head: normal shape and size; Ears: Tm's gray and intact, normal light reflex; Nose: mucosa pink and moist, septum midline; Throat/Mouth: Teeth present, mucosa erythematous and moist, tonsils 2+, no exudate, lesions or ulcerations noted. Cervical adenopathy noted on the left. Cardiovascular: Normal rate and rhythm. S1,S2 noted.  No murmur, rubs or gallops noted.  Pulmonary/Chest: Normal effort and positive vesicular breath sounds. No respiratory distress. No wheezes, rales or ronchi noted.    BMET    Component Value Date/Time   NA 139 06/25/2013 1143   K 4.5 06/25/2013 1143   CL 107 06/25/2013 1143   CO2 28 06/25/2013 1143   GLUCOSE 88 06/25/2013 1143   BUN 12 06/25/2013 1143   CREATININE 1.1 06/25/2013 1143   CALCIUM 9.3 06/25/2013 1143    Lipid Panel     Component Value Date/Time   CHOL 149  06/25/2013 1143   TRIG 110.0 06/25/2013 1143   HDL 29.30* 06/25/2013 1143   CHOLHDL 5 06/25/2013 1143   VLDL 22.0 06/25/2013 1143   LDLCALC 98 06/25/2013 1143         Assessment & Plan:   Strep throat:  RST: faintly positive Continue rest, fluids, ibuprofen and salt water gargles eRx for amoxil BID x 10 days  Allergic Rhinitis:  Will refill Dymista  RTC as needed or if symptoms persist or worsen

## 2014-02-22 NOTE — Progress Notes (Signed)
Pre visit review using our clinic review tool, if applicable. No additional management support is needed unless otherwise documented below in the visit note. 

## 2014-02-22 NOTE — Patient Instructions (Signed)

## 2014-02-23 ENCOUNTER — Encounter: Payer: Self-pay | Admitting: Internal Medicine

## 2014-02-23 ENCOUNTER — Telehealth: Payer: Self-pay | Admitting: Family Medicine

## 2014-02-23 ENCOUNTER — Ambulatory Visit (INDEPENDENT_AMBULATORY_CARE_PROVIDER_SITE_OTHER): Payer: BC Managed Care – PPO | Admitting: Internal Medicine

## 2014-02-23 VITALS — BP 114/70 | HR 112 | Temp 99.2°F | Wt 306.0 lb

## 2014-02-23 DIAGNOSIS — T50905A Adverse effect of unspecified drugs, medicaments and biological substances, initial encounter: Secondary | ICD-10-CM

## 2014-02-23 DIAGNOSIS — J020 Streptococcal pharyngitis: Secondary | ICD-10-CM

## 2014-02-23 MED ORDER — AZITHROMYCIN 250 MG PO TABS: ORAL_TABLET | ORAL | Status: DC

## 2014-02-23 MED ORDER — DEXAMETHASONE SODIUM PHOSPHATE 10 MG/ML IJ SOLN
10.0000 mg | Freq: Once | INTRAMUSCULAR | Status: AC
  Administered 2014-02-23: 10 mg via INTRAMUSCULAR

## 2014-02-23 NOTE — Progress Notes (Signed)
Pre visit review using our clinic review tool, if applicable. No additional management support is needed unless otherwise documented below in the visit note. 

## 2014-02-23 NOTE — Progress Notes (Signed)
Subjective:    Patient ID: Victor Zimmerman, male    DOB: 03/10/1986, 28 y.o.   MRN: [id number]  HPI  Pt presents to the clinic today with c/o hoarseness and blisters around his mouth. He reports this started yesterday. He was seen yesterday by me and diagnosed with strep throat. He was placed on amoxicillin. He reports these symptoms started after taking the antibiotic. He has not taken the antibiotic today. He has tried benadryl without relief. He reports that he has had a similar experience with this antibiotic prior- he forgot about it until it happened again.  Review of Systems      Past Medical History  Diagnosis Date  . Seasonal allergic rhinitis     and dust  . Obese     Current Outpatient Prescriptions  Medication Sig Dispense Refill  . acetaminophen (TYLENOL) 500 MG tablet Take 1,000 mg by mouth every 6 (six) hours as needed for pain.    Marland Kitchen. buPROPion (WELLBUTRIN XL) 150 MG 24 hr tablet Take 1 tablet (150 mg total) by mouth daily. 30 tablet 3  . fexofenadine (ALLEGRA) 180 MG tablet Take 180 mg by mouth daily.    Marland Kitchen. ibuprofen (ADVIL,MOTRIN) 200 MG tablet Take 800 mg by mouth every 6 (six) hours as needed for pain.    . Azelastine-Fluticasone (DYMISTA) 137-50 MCG/ACT SUSP Place 1 spray into the nose daily as needed. 1 Bottle 0   No current facility-administered medications for this visit.    No Known Allergies  Family History  Problem Relation Age of Onset  . Diabetes Other     maternal side  . Hypertension Mother   . Cancer Maternal Uncle     brain  . Cancer Maternal Uncle     unknown  . Cancer Maternal Aunt     unsure  . CAD Neg Hx     History   Social History  . Marital Status: Married    Spouse Name: N/A    Number of Children: N/A  . Years of Education: N/A   Occupational History  . Not on file.   Social History Main Topics  . Smoking status: Never Smoker   . Smokeless tobacco: Never Used  . Alcohol Use: No  . Drug Use: No  . Sexual Activity: Yes     Birth Control/ Protection: Condom   Other Topics Concern  . Not on file   Social History Narrative   Lives with wife, 5 children   Occupation: Arts administratorbay seller, starting own business   Edu: HS   Activity: no regular exercise   Diet: some water, vegetables daily     Constitutional: Denies fever, malaise, fatigue, headache or abrupt weight changes.  HEENT: Pt reports blisters around mouth and hoarsenss. Denies eye pain, eye redness, ear pain, ringing in the ears, wax buildup, runny nose, nasal congestion, bloody nose, . Respiratory: Denies difficulty breathing, shortness of breath, cough or sputum production.     No other specific complaints in a complete review of systems (except as listed in HPI above).  Objective:   Physical Exam   BP 114/70 mmHg  Pulse 112  Temp(Src) 99.2 F (37.3 C) (Oral)  Wt 306 lb (138.801 kg)  SpO2 97% Wt Readings from Last 3 Encounters:  02/23/14 306 lb (138.801 kg)  02/22/14 306 lb (138.801 kg)  11/03/13 307 lb 8 oz (139.481 kg)    General: Appears his stated age, obese but well developed, well nourished in NAD. HEENT: Head: normal shape and size;  Throat/Mouth: Teeth present, mucosa erythematous and moist, tonsils 2+ no exudate, lips swollen with multiple small blisters noted..  Neck: Normal range of motion. Neck supple, trachea midline.  Cardiovascular: Normal rate and rhythm. S1,S2 noted.  No murmur, rubs or gallops noted.  Pulmonary/Chest: Normal effort and positive vesicular breath sounds. No respiratory distress. No wheezes, rales or ronchi noted.   BMET    Component Value Date/Time   NA 139 06/25/2013 1143   K 4.5 06/25/2013 1143   CL 107 06/25/2013 1143   CO2 28 06/25/2013 1143   GLUCOSE 88 06/25/2013 1143   BUN 12 06/25/2013 1143   CREATININE 1.1 06/25/2013 1143   CALCIUM 9.3 06/25/2013 1143    Lipid Panel     Component Value Date/Time   CHOL 149 06/25/2013 1143   TRIG 110.0 06/25/2013 1143   HDL 29.30* 06/25/2013 1143    CHOLHDL 5 06/25/2013 1143   VLDL 22.0 06/25/2013 1143   LDLCALC 98 06/25/2013 1143    CBC No results found for: WBC, RBC, HGB, HCT, PLT, MCV, MCH, MCHC, RDW, LYMPHSABS, MONOABS, EOSABS, BASOSABS  Hgb A1C No results found for: HGBA1C      Assessment & Plan:   Allergic reaction to amoxil:  Stop antibiotic 10 mg Decadron IM today Continue benadryl for swelling If swelling persist or you have difficulty breathing, call 911 or go to nearest ER  Strep Pharyngitis:  Stop amoxil. Start azithromycin  RTC as needed or if symptoms persist or worsen

## 2014-02-23 NOTE — Patient Instructions (Signed)
Angioedema °Angioedema is a sudden swelling of tissues, often of the skin. It can occur on the face or genitals or in the abdomen or other body parts. The swelling usually develops over a short period and gets better in 24 to 48 hours. It often begins during the night and is found when the person wakes up. The person may also get red, itchy patches of skin (hives). Angioedema can be dangerous if it involves swelling of the air passages.  °Depending on the cause, episodes of angioedema may only happen once, come back in unpredictable patterns, or repeat for several years and then gradually fade away.  °CAUSES  °Angioedema can be caused by an allergic reaction to various triggers. It can also result from nonallergic causes, including reactions to drugs, immune system disorders, viral infections, or an abnormal gene that is passed to you from your parents (hereditary). For some people with angioedema, the cause is unknown.  °Some things that can trigger angioedema include:  °· Foods.   °· Medicines, such as ACE inhibitors, ARBs, nonsteroidal anti-inflammatory agents, or estrogen.   °· Latex.   °· Animal saliva.   °· Insect stings.   °· Dyes used in X-rays.   °· Mild injury.   °· Dental work. °· Surgery. °· Stress.   °· Sudden changes in temperature.   °· Exercise. °SIGNS AND SYMPTOMS  °· Swelling of the skin. °· Hives. If these are present, there is also intense itching. °· Redness in the affected area.   °· Pain in the affected area. °· Swollen lips or tongue. °· Breathing problems. This may happen if the air passages swell. °· Wheezing. °If internal organs are involved, there may be:  °· Nausea.   °· Abdominal pain.   °· Vomiting.   °· Difficulty swallowing.   °· Difficulty passing urine. °DIAGNOSIS  °· Your health care provider will examine the affected area and take a medical and family history. °· Various tests may be done to help determine the cause. Tests may include: °¨ Allergy skin tests to see if the problem  is an allergic reaction.   °¨ Blood tests to check for hereditary angioedema.   °¨ Tests to check for underlying diseases that could cause the condition.   °· A review of your medicines, including over-the-counter medicines, may be done. °TREATMENT  °Treatment will depend on the cause of the angioedema. Possible treatments include:  °· Removal of anything that triggered the condition (such as stopping certain medicines).   °· Medicines to treat symptoms or prevent attacks. Medicines given may include:   °¨ Antihistamines.   °¨ Epinephrine injection.   °¨ Steroids.   °· Hospitalization may be required for severe attacks. If the air passages are affected, it can be an emergency. Tubes may need to be placed to keep the airway open. °HOME CARE INSTRUCTIONS  °· Take all medicines as directed by your health care provider. °· If you were given medicines for emergency allergy treatment, always carry them with you. °· Wear a medical bracelet as directed by your health care provider.   °· Avoid known triggers. °SEEK MEDICAL CARE IF:  °· You have repeat attacks of angioedema.   °· Your attacks are more frequent or more severe despite preventive measures.   °· You have hereditary angioedema and are considering having children. It is important to discuss with your health care provider the risks of passing the condition on to your children. °SEEK IMMEDIATE MEDICAL CARE IF:  °· You have severe swelling of the mouth, tongue, or lips. °· You have difficulty breathing.   °· You have difficulty swallowing.   °· You faint. °MAKE   SURE YOU: °· Understand these instructions. °· Will watch your condition. °· Will get help right away if you are not doing well or get worse. °Document Released: 06/18/2001 Document Revised: 08/24/2013 Document Reviewed: 12/01/2012 °ExitCare® Patient Information ©2015 [employer]. This information is not intended to replace advice given to you by your health care provider. Make sure you discuss any questions  you have with your health care provider. ° °

## 2014-02-23 NOTE — Telephone Encounter (Signed)
Patient Information:  Caller Name: Corrie DandyMary  Phone: [phone]  Patient: Victor Zimmerman, Victor Zimmerman  Gender: Male  DOB: 12/25/1985  Age: 28 Years  PCP: Eustaquio BoydenGutierrez, [facility](Family Practice)  Office Follow Up:  Does the office need to follow up with this patient?: No  Instructions For The Office: N/A  RN Note:  Spouse states patient was seen in the office 02/22/14 for sore throat, fever and chills, onset 02/20/14. States patient was diagnosed with Strep Throat and prescribed Amoxicillin. Spouse states patient developed multiple fever blisters and swelling of both upper and lower lips, 02/23/14. Temp. 101 tympanic at 0600 02/23/14.  Care advice given per guidelines. Spouse advised Tylenol 650mg . q 4 hours as needed for fever/pain. Call back parameters reviewed. Spouse verbalizes understanding.  Symptoms  Reason For Call & Symptoms: Swelling/blisters on lips  Reviewed Health History In EMR: Yes  Reviewed Medications In EMR: Yes  Reviewed Allergies In EMR: Yes  Reviewed Surgeries / Procedures: Yes  Date of Onset of Symptoms: 02/23/2014  Any Fever: Yes  Fever Taken: Ear Thermometer  Fever Time Of Reading: 06:00:00  Fever Last Reading: 101  Guideline(s) Used:  Strep Throat Test Follow-Up Call  Cold Sores - Fever Blisters of Lip  Disposition Per Guideline:   Callback by PCP Today  Reason For Disposition Reached:   Herpes sores are Zimmerman recurrent problem, and caller wants Zimmerman prescription medicine to take the next time they occur  Advice Given:  General Information - Cold Sores  Fever blisters or cold sores occur on one side of the outer lip.  Typically last 7-10 days.  Contagiousness:  Herpes from cold sores is contagious to other people. Discourage picking or rubbing the sore. Don't open the blisters. Wash your hands frequently. The cold sores are contagious until dry (approximately 5-7 days). Most cold sore sufferers note Zimmerman tingling in the lip before the sore appears (prodromal phase). Patients  are also contagious during this period.  Eyes - Avoid spreading the virus to someone's eye by kissing or touching; an eye infection can be serious (herpes keratitis).  Mouth - Since the blisters and mouth secretions are contagious, avoid kissing other people during this time. Avoid sharing drinking glasses, eating utensils, or razors.  Sex - Avoid oral sex during this time. Herpes from sores on your mouth can spread to your partner's genital area.  Contact with Immunocompromised People - Avoid contact with anyone who has eczema or Zimmerman weakened immune system.  Call Back If:  Sores look infected (spreading redness)  Sores occur near or in the eye  You become worse  RN Overrode Recommendation:  Make Appointment  Nursing Judgement  Appointment Scheduled:  02/23/2014 13:00:00 Appointment Scheduled Provider:  Nicki ReaperBaity, [place]

## 2014-02-23 NOTE — Addendum Note (Signed)
Addended by: Roena MaladyEVONTENNO, Ermine Spofford Y on: 02/23/2014 02:01 PM   Modules accepted: Orders

## 2014-02-26 ENCOUNTER — Telehealth: Payer: Self-pay

## 2014-02-26 MED ORDER — AZELASTINE HCL 0.1 % NA SOLN: 1.0000 | Freq: Two times a day (BID) | NASAL | Status: DC

## 2014-02-26 MED ORDER — FLUTICASONE PROPIONATE 50 MCG/ACT NA SUSP: 1.0000 | Freq: Every day | NASAL | Status: DC

## 2014-02-26 NOTE — Telephone Encounter (Signed)
Victor Zimmerman at [facility] [employer] [place] left v/m; Dymista cost to pt after ins was $170.00. Request send separate rx for Azelastine 0.1% and Fluticasone 50 mcg for lower copay.Please advise.

## 2014-02-26 NOTE — Telephone Encounter (Signed)
Sent. Thanks.   

## 2014-06-10 ENCOUNTER — Ambulatory Visit (INDEPENDENT_AMBULATORY_CARE_PROVIDER_SITE_OTHER): Payer: 59 | Admitting: Family Medicine

## 2014-06-10 ENCOUNTER — Encounter: Payer: Self-pay | Admitting: Family Medicine

## 2014-06-10 VITALS — BP 128/82 | HR 68 | Temp 98.1°F | Wt 320.5 lb

## 2014-06-10 DIAGNOSIS — I491 Atrial premature depolarization: Secondary | ICD-10-CM | POA: Insufficient documentation

## 2014-06-10 DIAGNOSIS — I499 Cardiac arrhythmia, unspecified: Secondary | ICD-10-CM

## 2014-06-10 DIAGNOSIS — R41840 Attention and concentration deficit: Secondary | ICD-10-CM

## 2014-06-10 DIAGNOSIS — E669 Obesity, unspecified: Secondary | ICD-10-CM

## 2014-06-10 DIAGNOSIS — J302 Other seasonal allergic rhinitis: Secondary | ICD-10-CM

## 2014-06-10 DIAGNOSIS — M25562 Pain in left knee: Secondary | ICD-10-CM

## 2014-06-10 MED ORDER — FLUTICASONE PROPIONATE 50 MCG/ACT NA SUSP: 1.0000 | Freq: Every day | NASAL | Status: DC

## 2014-06-10 MED ORDER — AZELASTINE HCL 0.1 % NA SOLN: 1.0000 | Freq: Two times a day (BID) | NASAL | Status: DC

## 2014-06-10 MED ORDER — BUPROPION HCL ER (XL) 300 MG PO TB24: 300.0000 mg | ORAL_TABLET | Freq: Every day | ORAL | Status: DC

## 2014-06-10 NOTE — Progress Notes (Addendum)
BP 128/82 mmHg  Pulse 68  Temp(Src) 98.1 F (36.7 C) (Oral)  Wt 320 lb 8 oz (145.378 kg)   CC: med refill  Subjective:    Patient ID: Quillian QuinceMichael Attar, male    DOB: 12/09/1985, 29 y.o.   MRN: [id number]  HPI: Quillian QuinceMichael Kretchmer is a 29 y.o. male presenting on 06/10/2014 for Medication Refill   Allergic rhinitis - well controlled on astelin and flonase nasal sprays. Not regularly taking allegra. No recent sinusitis.  On wellbutrin for ADD. Feels focusing well. Would like increased dose however.   Obesity - hectic last year - family stressors. Wife had 5 surgeries, and 5th baby last year. Has started atkin's diet over last 3-4 weeks. No weight loss noted.  Trouble with regular activity - significant jumper's knee with activity over last few months. Has brace he uses. Has done exercises for this. Points to patellar tendon as site of pain, currently pain free.  Denies chest pain, tightness, dyspnea or palpitations.  Relevant past medical, surgical, family and social history reviewed and updated as indicated. Interim medical history since our last visit reviewed. Allergies and medications reviewed and updated. Current Outpatient Prescriptions on File Prior to Visit  Medication Sig  . acetaminophen (TYLENOL) 500 MG tablet Take 1,000 mg by mouth every 6 (six) hours as needed for pain.  . fexofenadine (ALLEGRA) 180 MG tablet Take 180 mg by mouth daily.  Marland Kitchen. ibuprofen (ADVIL,MOTRIN) 200 MG tablet Take 800 mg by mouth every 6 (six) hours as needed for pain.   No current facility-administered medications on file prior to visit.    Review of Systems Per HPI unless specifically indicated above     Objective:    BP 128/82 mmHg  Pulse 68  Temp(Src) 98.1 F (36.7 C) (Oral)  Wt 320 lb 8 oz (145.378 kg)  Wt Readings from Last 3 Encounters:  06/10/14 320 lb 8 oz (145.378 kg)  02/23/14 306 lb (138.801 kg)  02/22/14 306 lb (138.801 kg)  Body mass index is 42.29 kg/(m^2).   Physical Exam    Constitutional: He appears well-developed and well-nourished. No distress.  HENT:  Head: Normocephalic and atraumatic.  Nose: No mucosal edema.  Mouth/Throat: Oropharynx is clear and moist. No oropharyngeal exudate, posterior oropharyngeal edema, posterior oropharyngeal erythema or tonsillar abscesses.  Nasal mucosal injection  Eyes: Conjunctivae and EOM are normal. Pupils are equal, round, and reactive to light. No scleral icterus.  Neck: Normal range of motion. Neck supple. Carotid bruit is not present.  Cardiovascular: Normal rate, normal heart sounds and intact distal pulses.  An irregular rhythm present.  Occasional extrasystoles are present.  No murmur heard. Pulmonary/Chest: Breath sounds normal. No respiratory distress. He has no wheezes. He has no rales.  Musculoskeletal:  L Knee exam: No deformity on inspection. No pain with palpation of knee landmarks. No effusion/swelling noted. FROM in flex/extension without crepitus. Neg mcmurray test. No tenderness today at patellar tendon  Skin: Skin is warm and dry. No rash noted.  Nursing note and vitals reviewed.     Assessment & Plan:   Problem List Items Addressed This Visit    Seasonal allergic rhinitis - Primary    Significant improvement noted on astelin/flonase regimen. Refilled.       Obesity    Body mass index is 42.29 kg/(m^2). weight gain noted. Discussed diet/lifestyle changes to affect sustainable weight loss. Discussed phentermine bariatric pharmacotherapy - will check EKG and if stable, start 30mg  dose daily as boost to diet/lifestyle changes. Discussed  temporary course of bariatric med.  Discussed RTC 1-2 mo for f/u if started. Discussed side effects and adverse events to monitor for.      Relevant Medications   Phentermine HCl 30 MG TBDP   Left knee pain    Describes patellar tendinosis - provided with stretching exercises from [facility] pt advisor. Discussed walk run program to improve conditioning. Update if  not improving with treatment.      Attention and concentration deficit    wellbutrin working well for him but requests increase to  24hr extended release tablets daily.  Monitor arrhythmia on higher dose and if noticeable arrhythmia will need decrease back to  dose.      Arrhythmia    Anticipate benign PACs - will check EKG today and if stable, start phentermine. EKG - NSR rate 60s, occasional PAC, normal axis, intervals, no acute ST/T changes.      Relevant Orders   EKG 12-Lead (Completed)       Follow up plan: Return if symptoms worsen or fail to improve.

## 2014-06-10 NOTE — Assessment & Plan Note (Signed)
Body mass index is 42.29 kg/(m^2). weight gain noted. Discussed diet/lifestyle changes to affect sustainable weight loss. Discussed phentermine bariatric pharmacotherapy - will check EKG and if stable, start 30mg  dose daily as boost to diet/lifestyle changes. Discussed temporary course of bariatric med.  Discussed RTC 1-2 mo for f/u if started. Discussed side effects and adverse events to monitor for.

## 2014-06-10 NOTE — Assessment & Plan Note (Signed)
Describes patellar tendinosis - provided with stretching exercises from [facility] pt advisor. Discussed walk run program to improve conditioning. Update if not improving with treatment.

## 2014-06-10 NOTE — Progress Notes (Signed)
Pre visit review using our clinic review tool, if applicable. No additional management support is needed unless otherwise documented below in the visit note. 

## 2014-06-10 NOTE — Assessment & Plan Note (Signed)
Significant improvement noted on astelin/flonase regimen. Refilled.

## 2014-06-10 NOTE — Assessment & Plan Note (Addendum)
Anticipate benign PACs - will check EKG today and if stable, start phentermine. EKG - NSR rate 60s, occasional PAC, normal axis, intervals, no acute ST/T changes.

## 2014-06-10 NOTE — Patient Instructions (Addendum)
EKG today - if normal we will start phentermine trial. Look up walk run program online. Exercises for jumper's knee provided today. If we start phentermine, return in 1-2 months for follow up. I've refilled meds today.

## 2014-06-10 NOTE — Assessment & Plan Note (Signed)
wellbutrin working well for him but requests increase to 300mg  24hr extended release tablets daily.  Monitor arrhythmia on higher dose and if noticeable arrhythmia will need decrease back to 150mg  dose.

## 2014-06-11 ENCOUNTER — Telehealth: Payer: Self-pay

## 2014-06-11 MED ORDER — PHENTERMINE HCL 30 MG PO TBDP: 30.0000 mg | ORAL_TABLET | Freq: Every day | ORAL | Status: DC

## 2014-06-11 NOTE — Telephone Encounter (Signed)
Pharmacy notified.

## 2014-06-11 NOTE — Addendum Note (Signed)
Addended by: Eustaquio BoydenGUTIERREZ, Markeshia Giebel on: 06/11/2014 07:53 AM   Modules accepted: Orders

## 2014-06-11 NOTE — Telephone Encounter (Signed)
Our chart shows 30mg  tablets are available. If not available then would just do 30mg  capsules one daily.

## 2014-06-11 NOTE — Telephone Encounter (Signed)
Victor Zimmerman at [facility] left v/m requesting clarification for phentermine 30 mg. Received phone in med order for taking 1/2 tab daily for 1 week then 1 tab daily thereafter; phentermine 30 mg only comes in capsule form.Please advise.

## 2014-07-26 ENCOUNTER — Ambulatory Visit: Payer: 59 | Admitting: Family Medicine

## 2014-08-11 ENCOUNTER — Telehealth: Payer: Self-pay | Admitting: Family Medicine

## 2014-08-11 NOTE — Telephone Encounter (Signed)
Pt has appt with Dr Sharen HonesGutierrez on 08/12/14 at 10:30 AM.

## 2014-08-11 NOTE — Telephone Encounter (Signed)
Will see tomorrow

## 2014-08-11 NOTE — Telephone Encounter (Signed)
Patient Name: Victor QuinceMICHAEL Noga DOB: 04/29/1985 Initial Comment Caller states c/o low blood pressure - dizziness Nurse Assessment Nurse: Renaldo FiddlerAdkins, RN, Raynelle FanningJulie Date/Time Victor Zimmerman(Eastern Time): 08/11/2014 12:21:47 PM Confirm and document reason for call. If symptomatic, describe symptoms. ---Caller states he thinks his b/p might be low. Sunday he woke up with a headache, took Excedrin migraine and at 11 a-12 p, he felt lightheaded and dizzy. He has hx of hypoglycemia, got something to eat and drink and eventually felt better. Monday the dizziness was intermittent, Tuesday he felt better but he is still concerned, felt out of it on Sunday. His b/p on Tuesday was 131/77. Adds now that Sunday he felt chest pressure, does not know how long it lasted but it is not present today. Today he has had an episode of dizziness, lasted approx 10 secs and then he felt fine. He has been forcing fluids , but has not eaten today. Has the patient traveled out of the country within the last 30 days? ---Not Applicable Does the patient require triage? ---Yes Related visit to physician within the last 2 weeks? ---No Does the PT have any chronic conditions? (i.e. diabetes, asthma, etc.) ---Yes List chronic conditions. ---phentermine for appetite suppressant, ADD, Hx of hypoglycemia per pt. Guidelines Guideline Title Affirmed Question Affirmed Notes Dizziness - Lightheadedness [1] MODERATE dizziness (e.g., interferes with normal activities) AND [2] has NOT been evaluated by physician for this (Exception: dizziness caused by heat exposure, sudden standing, or poor fluid intake) Final Disposition User See Physician within [address]24 Hours [place], Charity fundraiserN, [place]

## 2014-08-12 ENCOUNTER — Encounter: Payer: Self-pay | Admitting: Family Medicine

## 2014-08-12 ENCOUNTER — Ambulatory Visit (INDEPENDENT_AMBULATORY_CARE_PROVIDER_SITE_OTHER): Payer: 59 | Admitting: Family Medicine

## 2014-08-12 VITALS — BP 100/66 | HR 75 | Temp 98.1°F | Wt 289.5 lb

## 2014-08-12 DIAGNOSIS — R42 Dizziness and giddiness: Secondary | ICD-10-CM | POA: Diagnosis not present

## 2014-08-12 DIAGNOSIS — E669 Obesity, unspecified: Secondary | ICD-10-CM

## 2014-08-12 DIAGNOSIS — R41840 Attention and concentration deficit: Secondary | ICD-10-CM

## 2014-08-12 NOTE — Assessment & Plan Note (Addendum)
Describes combination of recent episode of mild vertigo and longstanding orthostatic lightheadedness. Not consistent with BPV or vestibular neuritis. Not consistent with hypoglycemia as sxs did not improve after peanut butter/juice. Check orthostatic vital signs today - but pt endorses staying well hydrated. ?med related (wellbutrin and phentermine). If recurrent dizziness, pt will contact me to come in for labwork (TSH, CBC, CMP) and we will decrease doses of both wellbutrin and phentermine - pt opts to continue at current doses for now.

## 2014-08-12 NOTE — Assessment & Plan Note (Addendum)
Congratulated on weight loss to date. Phentermine and healthy diet /lifestyle changes have been effective. Body mass index is 38.2 kg/(m^2).

## 2014-08-12 NOTE — Patient Instructions (Signed)
I wonder about meds causing some dizziness. No changes today. If persistent dizziness, I would recommend decreasing both wellbutrin and phentermine. Let me know if this happens. Orthostatic vital signs checked today and normal.

## 2014-08-12 NOTE — Progress Notes (Signed)
Pre visit review using our clinic review tool, if applicable. No additional management support is needed unless otherwise documented below in the visit note. 

## 2014-08-12 NOTE — Assessment & Plan Note (Signed)
Continue current wellbutrin 300mg  XL dose.

## 2014-08-12 NOTE — Progress Notes (Signed)
BP 106/70 mmHg  Pulse 58  Temp(Src) 98.1 F (36.7 C) (Oral)  Wt 289 lb 8 oz (131.316 kg)  SpO2 97%   CC: dizziness  Subjective:    Patient ID: Victor Zimmerman, male    DOB: [birth year], 29 y.o.   MRN: [id number]  HPI: Victor Zimmerman is a 29 y.o. male presenting on 08/12/2014 for Dizziness   On phentermine  daily for appetite suppression and wellbutrin XL  daily for ADD. 31lb weight loss noted over last 2 months. Big diet change and increased regular exercising.   Took excedrin migraine on empty stomach for headache that developed Sunday morning. Later that morning had episode at church while standing where he started feeling dizzy described as mild "room spinning" sensation and lightheadedness but still able to ambulate - dizziness persisted for several hours despite sitting down. Did not improve with peanut butter/juice. Improved after taking nap. Possible mild chest pressure with this. No presyncope. H/o vertigo in the past.   Since then, feeling better but still has persistent mild orthostatic dizziness ongoing over last several years.   Did start phentermine  daily 2 months ago, taking daily.   No fevers/chills, cold/congestion sxs. No hearing changes, nausea/vomiting, tinnitus.   Relevant past medical, surgical, family and social history reviewed and updated as indicated. Interim medical history since our last visit reviewed. Allergies and medications reviewed and updated. Current Outpatient Prescriptions on File Prior to Visit  Medication Sig  . acetaminophen (TYLENOL) 500 MG tablet Take 1,000 mg by mouth every 6 (six) hours as needed for pain.  Marland Kitchen azelastine (ASTELIN) 0.1 % nasal spray Place 1 spray into both nostrils 2 (two) times daily. (Patient taking differently: Place 1 spray into both nostrils 2 (two) times daily as needed. )  . buPROPion (WELLBUTRIN XL) 300 MG 24 hr tablet Take 1 tablet (300 mg total) by mouth daily.  . fexofenadine (ALLEGRA) 180 MG tablet  Take 180 mg by mouth daily as needed.   . fluticasone (FLONASE) 50 MCG/ACT nasal spray Place 1 spray into both nostrils daily. (Patient taking differently: Place 1 spray into both nostrils daily as needed. )  . ibuprofen (ADVIL,MOTRIN) 200 MG tablet Take 800 mg by mouth every 6 (six) hours as needed for pain.  Marland Kitchen Phentermine HCl 30 MG TBDP Take 30 mg by mouth daily.   No current facility-administered medications on file prior to visit.   Past Medical History  Diagnosis Date  . Seasonal allergic rhinitis     and dust  . Morbid obesity     Past Surgical History  Procedure Laterality Date  . No past surgeries      Family History  Problem Relation Age of Onset  . Diabetes Other     maternal side  . Hypertension Mother   . Cancer Maternal Uncle     brain  . Cancer Maternal Uncle     unknown  . Cancer Maternal Aunt     unsure  . CAD Neg Hx     Review of Systems Per HPI unless specifically indicated above     Objective:    BP 106/70 mmHg  Pulse 58  Temp(Src) 98.1 F (36.7 C) (Oral)  Wt 289 lb 8 oz (131.316 kg)  SpO2 97%  Wt Readings from Last 3 Encounters:  08/12/14 289 lb 8 oz (131.316 kg)  06/10/14 320 lb 8 oz (145.378 kg)  02/23/14 306 lb (138.801 kg)    Physical Exam  Constitutional: He is oriented to person, place,  and time. He appears well-developed and well-nourished. No distress.  HENT:  Mouth/Throat: Oropharynx is clear and moist. No oropharyngeal exudate.  Eyes: Conjunctivae and EOM are normal. Pupils are equal, round, and reactive to light. No scleral icterus.  Neck: Normal range of motion. Neck supple. No thyromegaly present.  Cardiovascular: Normal rate, regular rhythm, normal heart sounds and intact distal pulses.   No murmur heard. Pulmonary/Chest: Effort normal and breath sounds normal. No respiratory distress. He has no wheezes. He has no rales.  Musculoskeletal: He exhibits no edema.  Lymphadenopathy:    He has no cervical adenopathy.    Neurological: He is alert and oriented to person, place, and time. He has normal strength. No cranial nerve deficit or sensory deficit. He displays a negative Romberg sign. Coordination and gait normal.  CN 2-12 intact FTN intact No pronator drift  Psychiatric: He has a normal mood and affect.  Nursing note and vitals reviewed.     Assessment & Plan:   Problem List Items Addressed This Visit    Obesity (BMI 35.0-39.9 without comorbidity)    Congratulated on weight loss to date. Phentermine and healthy diet /lifestyle changes have been effective. Body mass index is 38.2 kg/(m^2).       Dizziness - Primary    Describes combination of recent episode of mild vertigo and longstanding orthostatic lightheadedness. Not consistent with BPV or vestibular neuritis. Not consistent with hypoglycemia as sxs did not improve after peanut butter/juice. Check orthostatic vital signs today - but pt endorses staying well hydrated. ?med related (wellbutrin and phentermine). If recurrent dizziness, pt will contact me to come in for labwork (TSH, CBC, CMP) and we will decrease doses of both wellbutrin and phentermine - pt opts to continue at current doses for now.      Attention and concentration deficit    Continue current wellbutrin 300mg  XL dose.          Follow up plan: Return if symptoms worsen or fail to improve.

## 2014-08-23 ENCOUNTER — Other Ambulatory Visit: Payer: Self-pay | Admitting: Family Medicine

## 2014-08-23 NOTE — Telephone Encounter (Signed)
Ok to refill 

## 2014-08-24 NOTE — Telephone Encounter (Signed)
plz phone in. 

## 2014-08-24 NOTE — Telephone Encounter (Signed)
Medication phoned to pharmacy.  

## 2014-09-30 ENCOUNTER — Other Ambulatory Visit: Payer: Self-pay | Admitting: Family Medicine

## 2014-09-30 NOTE — Telephone Encounter (Signed)
Ok to refill 

## 2014-09-30 NOTE — Telephone Encounter (Signed)
Rx called in as directed.   

## 2014-09-30 NOTE — Telephone Encounter (Signed)
plz phone in. 

## 2015-01-21 ENCOUNTER — Encounter: Payer: Self-pay | Admitting: Family Medicine

## 2015-01-21 ENCOUNTER — Ambulatory Visit (INDEPENDENT_AMBULATORY_CARE_PROVIDER_SITE_OTHER): Payer: 59 | Admitting: Family Medicine

## 2015-01-21 VITALS — BP 108/78 | HR 68 | Temp 98.4°F | Wt 289.8 lb

## 2015-01-21 DIAGNOSIS — J020 Streptococcal pharyngitis: Secondary | ICD-10-CM | POA: Diagnosis not present

## 2015-01-21 DIAGNOSIS — R509 Fever, unspecified: Secondary | ICD-10-CM

## 2015-01-21 DIAGNOSIS — J029 Acute pharyngitis, unspecified: Secondary | ICD-10-CM | POA: Diagnosis not present

## 2015-01-21 HISTORY — DX: Streptococcal pharyngitis: J02.0

## 2015-01-21 LAB — POCT RAPID STREP A (OFFICE): Rapid Strep A Screen: POSITIVE — AB

## 2015-01-21 MED ORDER — AZITHROMYCIN 250 MG PO TABS: ORAL_TABLET | ORAL | Status: DC

## 2015-01-21 NOTE — Patient Instructions (Signed)

## 2015-01-21 NOTE — Progress Notes (Signed)
BP 108/78 mmHg  Pulse 68  Temp(Src) 98.4 F (36.9 C) (Oral)  Wt 289 lb 12 oz (131.43 kg)   CC: ST  Subjective:    Patient ID: Victor Zimmerman, male    DOB: 1985/12/09, 29 y.o.   MRN: [id number]  HPI: Victor Zimmerman is a 29 y.o. male presenting on 01/21/2015 for Sore Throat   5d h/o sudden onset of fevers, chills. Today started with sore throat and swollen gland and earache. Increased fatigue. + headache. Back pain with deep breathing. + body aches.   No abd pain, nausea/vomiting, diarrhea, cough, congestion, neck stiffness. No tooth pain, PNDrainage.   + 5 children sick as well with GI viral illness.  No smokers at home. No h/o asthma.  Taking ibuprofen/tylenol for this.   BP Readings from Last 3 Encounters:  01/21/15 108/78  08/12/14 100/66  06/10/14 128/82   Relevant past medical, surgical, family and social history reviewed and updated as indicated. Interim medical history since our last visit reviewed. Allergies and medications reviewed and updated. Current Outpatient Prescriptions on File Prior to Visit  Medication Sig  . acetaminophen (TYLENOL) 500 MG tablet Take 1,000 mg by mouth every 6 (six) hours as needed for pain.  Marland Kitchen azelastine (ASTELIN) 0.1 % nasal spray Place 1 spray into both nostrils 2 (two) times daily. (Patient taking differently: Place 1 spray into both nostrils 2 (two) times daily as needed. )  . buPROPion (WELLBUTRIN XL) 300 MG 24 hr tablet Take 1 tablet (300 mg total) by mouth daily.  . fexofenadine (ALLEGRA) 180 MG tablet Take 180 mg by mouth daily as needed.   . fluticasone (FLONASE) 50 MCG/ACT nasal spray Place 1 spray into both nostrils daily. (Patient taking differently: Place 1 spray into both nostrils daily as needed. )  . ibuprofen (ADVIL,MOTRIN) 200 MG tablet Take 800 mg by mouth every 6 (six) hours as needed for pain.  . phentermine 30 MG capsule TAKE ONE CAPSULE BY MOUTH DAILY   No current facility-administered medications on file prior to  visit.    Review of Systems Per HPI unless specifically indicated above     Objective:    BP 108/78 mmHg  Pulse 68  Temp(Src) 98.4 F (36.9 C) (Oral)  Wt 289 lb 12 oz (131.43 kg)  Wt Readings from Last 3 Encounters:  01/21/15 289 lb 12 oz (131.43 kg)  08/12/14 289 lb 8 oz (131.316 kg)  06/10/14 320 lb 8 oz (145.378 kg)    Physical Exam  Constitutional: He appears well-developed and well-nourished. No distress.  HENT:  Head: Normocephalic and atraumatic.  Right Ear: Hearing, external ear and ear canal normal.  Left Ear: Hearing, tympanic membrane, external ear and ear canal normal.  Nose: Nose normal. No mucosal edema or rhinorrhea. Right sinus exhibits no maxillary sinus tenderness and no frontal sinus tenderness. Left sinus exhibits no maxillary sinus tenderness and no frontal sinus tenderness.  Mouth/Throat: Uvula is midline and mucous membranes are normal. Posterior oropharyngeal edema and posterior oropharyngeal erythema present. No oropharyngeal exudate or tonsillar abscesses.  Retracted L TM slightly erythematous Nasal mucosal inflammation Swollen erythematous tonsils but no obvious exudate  Eyes: Conjunctivae and EOM are normal. Pupils are equal, round, and reactive to light. No scleral icterus.  Neck: Normal range of motion. Neck supple.  Cardiovascular: Normal rate, regular rhythm, normal heart sounds and intact distal pulses.   No murmur heard. Pulmonary/Chest: Effort normal and breath sounds normal. No respiratory distress. He has no wheezes. He has no  rales.  Lymphadenopathy:    He has no cervical adenopathy.  Skin: Skin is warm and dry. No rash noted.  Nursing note and vitals reviewed.  Results for orders placed or performed in visit on 01/21/15  POCT rapid strep A  Result Value Ref Range   Rapid Strep A Screen Positive (A) Negative      Assessment & Plan:   Problem List Items Addressed This Visit    Strep pharyngitis - Primary    Subjective  fevers/chills, body aches, malaise and fatigue, now with sore throat and positive RST. Treat as strep throat with zpack course (in amox allergy, and pt states keflex not as effective in the past). Continue ibuprofen, fluids, rest. Update if not improving with treatment.      Relevant Medications   azithromycin (ZITHROMAX) 250 MG tablet    Other Visit Diagnoses    Sore throat        Relevant Orders    POCT rapid strep A (Completed)    Other specified fever        Relevant Orders    POCT rapid strep A (Completed)        Follow up plan: Return if symptoms worsen or fail to improve.

## 2015-01-21 NOTE — Assessment & Plan Note (Addendum)
Subjective fevers/chills, body aches, malaise and fatigue, now with sore throat and positive RST. Treat as strep throat with zpack course (in amox allergy, and pt states keflex not as effective in the past). Continue ibuprofen, fluids, rest. Update if not improving with treatment.

## 2015-01-21 NOTE — Progress Notes (Signed)
Pre visit review using our clinic review tool, if applicable. No additional management support is needed unless otherwise documented below in the visit note. 

## 2015-01-24 ENCOUNTER — Telehealth: Payer: Self-pay

## 2015-01-24 NOTE — Telephone Encounter (Signed)
PLEASE NOTE: All timestamps contained within this report are represented as [place] Standard Time. CONFIDENTIALTY NOTICE: This fax transmission is intended only for the addressee. It contains information that is legally privileged, confidential or otherwise protected from use or disclosure. If you are not the intended recipient, you are strictly prohibited from reviewing, disclosing, copying using or disseminating any of this information or taking any action in reliance on or regarding this information. If you have received this fax in error, please notify us immediately by telephone so that we can arrange for its return to [place]. Phone: [phone], Toll-Free: [phone], Fax: [phone] Page: 1 of 3 Call Id: [id number] [facility] Night - Client TELEPHONE ADVICE RECORD [facility] Patient Name: Victor Zimmerman Gender: Male DOB: 1986/02/05 Age: 29 Y 9 M 23 D Return Phone Number: [phone] (Primary) Address: City/State/Zip: [place] Client [facility] Night - Client Client Site [facility] [place] - Night Physician [name] Contact Type Call Call Type Triage / Clinical Relationship To Patient Self Return Phone Number [phone] (Primary) Chief Complaint Cold Exposure / Hypothermia (not frostbite) Initial Comment Caller states he needs his Rx called in to a different pharmacy for his strep throat Nurse Assessment Nurse: Lucretia Field, RN, Santina Evans Date/Time (Eastern Time): 01/22/2015 3:22:30 PM Please select the assessment type ---RX called in but not at pharm Additional Documentation ---Caller states he needs his Rx called in to a different pharmacy for his strep throat Nurse: Lucretia Field, RN, Santina Evans Date/Time (Eastern Time): 01/22/2015 3:57:53 PM Please select the assessment type ---RX called in but not at pharm Additional Documentation ---He saw the dr yesterday. Called into [facility]. They only had 3 of  6 pills. They closed at one. 2 first day. Document the name of the medication. ---Antibiotic z pack. Pharmacy ran out. azithromycin 2 pills then 1 a day for 5 days 250 mg. Pharmacy name and phone number. ---Pharmacy [facility] [phone] Has the office closed within the last 30 minutes? ---No Does the client directives allow for assistance with medications after hours? ---Yes Is there an on-call physician for the client? ---Yes Additional Documentation ---Pharmacy only had 3 of patients 6 pills and closed before he could get the other 3 antibiotic pills. Guidelines Guideline Title Affirmed Question Affirmed Notes Nurse Date/Time (Eastern Time) Disp. Time Lamount Cohen Time) Disposition Final User 01/22/2015 3:23:54 PM Attempt made - message left Isidoro Donning 01/22/2015 4:17:33 PM Called On-Call Provider Lucretia Field, RN, Catherine 01/22/2015 4:20:44 PM Called On-Call Provider Lucretia Field, RN, Santina Evans 01/22/2015 4:24:25 PM Pharmacy Call Lucretia Field, RN, Santina Evans Reason: azithromycin 3 pills PLEASE NOTE: All timestamps contained within this report are represented as [place] Standard Time. CONFIDENTIALTY NOTICE: This fax transmission is intended only for the addressee. It contains information that is legally privileged, confidential or otherwise protected from use or disclosure. If you are not the intended recipient, you are strictly prohibited from reviewing, disclosing, copying using or disseminating any of this information or taking any action in reliance on or regarding this information. If you have received this fax in error, please notify us immediately by telephone so that we can arrange for its return to [place]. Phone: [phone], Toll-Free: [phone], Fax: [phone] Page: 2 of 3 Call Id: [id number] Disp. Time Lamount Cohen Time) Disposition Final User 01/22/2015 4:25:48 PM Pharmacy Call Lucretia Field, RN, Santina Evans Reason: azithromycin 01/22/2015 4:26:07 PM Call Completed Lucretia Field, RN,  Catherine 01/22/2015 4:24:41 PM Clinical Call Yes Lucretia Field, RN, Santina Evans After Care Instructions Given Call Event Type User  Date / Time Description Verbal Orders/Maintenance Medications Medication Refill Route Dosage Regime Duration Admin Instructions User Name azithromycin 250 mg z pack- the last 3 pills Oral 1 pill QD 3 Days Take 1 pill for 3 days Lucretia Field, RN, [facility] Phone DateTime Result/Outcome Message Type Notes Hannah Beat [id number] 01/22/2015 4:17:33 PM Called On Call Provider - Reached Doctor Paged Hannah Beat 01/22/2015 4:17:43 PM Spoke with On Call - General Message Result orders received Hannah Beat [id number] 01/22/2015 4:20:44 PM Called On Call Provider - Reached Doctor Paged Hannah Beat 01/22/2015 4:21:04 PM Spoke with On Call - General Message Result orders received PLEASE NOTE: All timestamps contained within this report are represented as [place] Standard Time. CONFIDENTIALTY NOTICE: This fax transmission is intended only for the addressee. It contains information that is legally privileged, confidential or otherwise protected from use or disclosure. If you are not the intended recipient, you are strictly prohibited from reviewing, disclosing, copying using or disseminating any of this information or taking any action in reliance on or regarding this information. If you have received this fax in error, please notify us immediately by telephone so that we can arrange for its return to [place]. Phone: [phone], Toll-Free: [phone], Fax: [phone] Page: 3 of 3 Call Id: [id number] [facility] [address], [address] [place], [place] [address] [phone] Fax: [phone] MEDICATION ORDER [facility] Night - Client [facility] - Night Date: 01/22/2015 From: [facility]I Department To: [name] Please sign the order for the  approved drug(s) given by our call center nurse on your behalf. Fax to [phone] within 5 business days. Thank you. Date Lamount Cohen Time): 01/22/2015 3:15:06 PM Triage RN: Elenore Paddy, RN NAME: Quillian Quince PHONE NUMBER: [phone] (Primary) BIRTHDATE: [birth year] ADDRESS: CITY/STATE/ZIP: [place] CALLER: Self NAME: Rx Given Medication Refill Route Dosage Regime Duration Admin Instructions azithromycin 250 mg z packthe last 3 pills Oral 1 pill QD 3 Days Take 1 pill for 3 days MD Signature Date

## 2015-01-24 NOTE — Telephone Encounter (Signed)
Noted  

## 2015-01-26 ENCOUNTER — Ambulatory Visit (INDEPENDENT_AMBULATORY_CARE_PROVIDER_SITE_OTHER): Payer: 59 | Admitting: Family Medicine

## 2015-01-26 ENCOUNTER — Encounter: Payer: Self-pay | Admitting: Family Medicine

## 2015-01-26 VITALS — BP 110/70 | HR 76 | Temp 97.9°F | Wt 291.0 lb

## 2015-01-26 DIAGNOSIS — R209 Unspecified disturbances of skin sensation: Secondary | ICD-10-CM

## 2015-01-26 DIAGNOSIS — R208 Other disturbances of skin sensation: Secondary | ICD-10-CM | POA: Diagnosis not present

## 2015-01-26 NOTE — Patient Instructions (Signed)
I think this may be strep related symptom.  Let's give it some time. Don't think zpack related. Watch for new rash developing.  Use moisturizing cream on skin like aveeno, cetaphil, eucerin

## 2015-01-26 NOTE — Assessment & Plan Note (Addendum)
On forearm, scalp, back. No rash. ?strep related. Started prior to starting zpack. If persistent consider eval for paresthesias.

## 2015-01-26 NOTE — Progress Notes (Signed)
   BP 110/70 mmHg  Pulse 76  Temp(Src) 97.9 F (36.6 C) (Oral)  Wt 291 lb (131.997 kg)   CC: check skin  Subjective:    Patient ID: Victor Zimmerman, male    DOB: 12-Jul-1985, 29 y.o.   MRN: [id number]  HPI: Victor Zimmerman is a 29 y.o. male presenting on 01/26/2015 for Skin Problem   Seen here 9/30 with strep pharyngitis, treated with zpack (amox allergy). Finished abx yesterday. No h/o zpack reaction in the past.   Now presenting with strange skin sensation - several areas of skin are tender to touch. Describes cold burn pain R forearm and back. Some paresthesias but no numbness. sxs started prior to antibiotic.   No rash.   Relevant past medical, surgical, family and social history reviewed and updated as indicated. Interim medical history since our last visit reviewed. Allergies and medications reviewed and updated. Current Outpatient Prescriptions on File Prior to Visit  Medication Sig  . acetaminophen (TYLENOL) 500 MG tablet Take 1,000 mg by mouth every 6 (six) hours as needed for pain.  Marland Kitchen azelastine (ASTELIN) 0.1 % nasal spray Place 1 spray into both nostrils 2 (two) times daily. (Patient taking differently: Place 1 spray into both nostrils 2 (two) times daily as needed. )  . buPROPion (WELLBUTRIN XL) 300 MG 24 hr tablet Take 1 tablet (300 mg total) by mouth daily.  . fluticasone (FLONASE) 50 MCG/ACT nasal spray Place 1 spray into both nostrils daily. (Patient taking differently: Place 1 spray into both nostrils daily as needed. )  . ibuprofen (ADVIL,MOTRIN) 200 MG tablet Take 800 mg by mouth every 6 (six) hours as needed for pain.  . phentermine 30 MG capsule TAKE ONE CAPSULE BY MOUTH DAILY  . fexofenadine (ALLEGRA) 180 MG tablet Take 180 mg by mouth daily as needed.    No current facility-administered medications on file prior to visit.    Review of Systems Per HPI unless specifically indicated above     Objective:    BP 110/70 mmHg  Pulse 76  Temp(Src) 97.9 F (36.6  C) (Oral)  Wt 291 lb (131.997 kg)  Wt Readings from Last 3 Encounters:  01/26/15 291 lb (131.997 kg)  01/21/15 289 lb 12 oz (131.43 kg)  08/12/14 289 lb 8 oz (131.316 kg)    Physical Exam  Constitutional: He appears well-developed and well-nourished. No distress.  HENT:  Mouth/Throat: Oropharynx is clear and moist. No oropharyngeal exudate.  Improved pharyngeal erythema  Neurological:  Sensation intact  Skin: Skin is warm and dry. No rash noted.  No skin rash  Nursing note and vitals reviewed.  Results for orders placed or performed in visit on 01/21/15  POCT rapid strep A  Result Value Ref Range   Rapid Strep A Screen Positive (A) Negative      Assessment & Plan:   Problem List Items Addressed This Visit    Cold sensation of skin - Primary    On forearm, scalp, back. No rash. ?strep related. Started prior to starting zpack. If persistent consider eval for paresthesias.           Follow up plan: No Follow-up on file.

## 2015-01-26 NOTE — Progress Notes (Signed)
Pre visit review using our clinic review tool, if applicable. No additional management support is needed unless otherwise documented below in the visit note. 

## 2015-03-31 ENCOUNTER — Encounter: Payer: Self-pay | Admitting: Family Medicine

## 2015-03-31 ENCOUNTER — Telehealth: Payer: Self-pay

## 2015-03-31 ENCOUNTER — Telehealth: Payer: Self-pay | Admitting: Family Medicine

## 2015-03-31 ENCOUNTER — Ambulatory Visit: Payer: 59 | Admitting: Family Medicine

## 2015-03-31 ENCOUNTER — Ambulatory Visit (INDEPENDENT_AMBULATORY_CARE_PROVIDER_SITE_OTHER): Payer: 59 | Admitting: Family Medicine

## 2015-03-31 VITALS — BP 110/76 | HR 64 | Temp 98.2°F | Wt 292.5 lb

## 2015-03-31 DIAGNOSIS — I491 Atrial premature depolarization: Secondary | ICD-10-CM | POA: Diagnosis not present

## 2015-03-31 DIAGNOSIS — M940 Chondrocostal junction syndrome [Tietze]: Secondary | ICD-10-CM | POA: Insufficient documentation

## 2015-03-31 MED ORDER — IBUPROFEN 200 MG PO TABS: 800.0000 mg | ORAL_TABLET | Freq: Three times a day (TID) | ORAL | Status: DC | PRN

## 2015-03-31 NOTE — Progress Notes (Signed)
BP 110/76 mmHg  Pulse 64  Temp(Src) 98.2 F (36.8 C) (Oral)  Wt 292 lb 8 oz (132.677 kg)   CC: chest pain  Subjective:    Patient ID: Victor Zimmerman, male    DOB: 09/09/1985, 29 y.o.   MRN: [id number]  HPI: Victor Zimmerman is a 29 y.o. male presenting on 03/31/2015 for Chest Pain   [birth year]! Comes in for acute visit for chest pain.   Ongoing musculoskeletal chest and back pain over last 2 years that has not been evaluated. Started after adjustment by chiropractor. Since then has to pop thoracic spine several times daily to get relief. Also has felt sternum pop with this. This morning 8:30am started noticing persistent dull ache mid left chest. He also has reproducible tenderness to left chest wall. Aggravated with chest wall muscle usage (ie lifting children) or deep breathing. Improved with rest and popping chest (but has been able to relieve pressure this morning). Not relieved with leaning forward. No chest wall swelling.   He did take ibuprofen this morning which has significantly helped.   No nausea, dyspnea, GERD sxs, fevers/chills or coughing, tachycardia or palpitations.   He is on phentermine 30mg  daily for weight loss but no weight loss noted.   He takes wellbutrin 300mg  XR daily for ADD.  No fmhx CAD. Nonsmoker.  Known PACs by prior EKG 05/2014.  Only cardiac risk factors includes sedentary lifestyle and obesity with Body mass index is 38.6 kg/(m^2).   Relevant past medical, surgical, family and social history reviewed and updated as indicated. Interim medical history since our last visit reviewed. Allergies and medications reviewed and updated. Current Outpatient Prescriptions on File Prior to Visit  Medication Sig  . azelastine (ASTELIN) 0.1 % nasal spray Place 1 spray into both nostrils 2 (two) times daily. (Patient taking differently: Place 1 spray into both nostrils 2 (two) times daily as needed. )  . buPROPion (WELLBUTRIN XL) 300 MG 24 hr tablet Take 1  tablet (300 mg total) by mouth daily.  . fluticasone (FLONASE) 50 MCG/ACT nasal spray Place 1 spray into both nostrils daily. (Patient taking differently: Place 1 spray into both nostrils daily as needed. )  . phentermine 30 MG capsule TAKE ONE CAPSULE BY MOUTH DAILY  . acetaminophen (TYLENOL) 500 MG tablet Take 1,000 mg by mouth every 6 (six) hours as needed for pain.  . fexofenadine (ALLEGRA) 180 MG tablet Take 180 mg by mouth daily as needed.    No current facility-administered medications on file prior to visit.    Review of Systems Per HPI unless specifically indicated in ROS section     Objective:    BP 110/76 mmHg  Pulse 64  Temp(Src) 98.2 F (36.8 C) (Oral)  Wt 292 lb 8 oz (132.677 kg)  Wt Readings from Last 3 Encounters:  03/31/15 292 lb 8 oz (132.677 kg)  01/26/15 291 lb (131.997 kg)  01/21/15 289 lb 12 oz (131.43 kg)    Physical Exam  Constitutional: He appears well-developed and well-nourished. No distress.  HENT:  Mouth/Throat: Oropharynx is clear and moist. No oropharyngeal exudate.  Cardiovascular: Normal rate, normal heart sounds and intact distal pulses.  An irregular rhythm present.  Occasional extrasystoles are present.  No murmur heard. Pulmonary/Chest: Effort normal and breath sounds normal. No respiratory distress. He has no wheezes. He has no rales. He exhibits tenderness. He exhibits no mass.    Reproducible chest wall pain at left second costochondral junction.  Psychiatric: He has  a normal mood and affect.  Nursing note and vitals reviewed.      Assessment & Plan:   Problem List Items Addressed This Visit    PAC (premature atrial contraction)    Persistent, asxs.      Costochondritis - Primary    Story/exam consistent with costochondritis. Discussed etiology. rec ice/heating pad and NSAID. Recommended chest wall gentle stretching. Update if no better, consider PT referral. Pt agrees with plan. He will use OTC NSAID.          Follow up  plan: Return if symptoms worsen or fail to improve.

## 2015-03-31 NOTE — Telephone Encounter (Signed)
Patient Name: Victor QuinceMICHAEL Zimmerman  DOB: 10/03/1985    Initial Comment Caller states he has chest pain.    Nurse Assessment  Nurse: Berneice GandyGrubb, RN, Autumn Date/Time Victor Zimmerman(Eastern Time): 03/31/2015 10:39:19 AM  Confirm and document reason for call. If symptomatic, describe symptoms. ---Caller states he has intermittent chest pain since 8-9 AM. Crushing pain at times, last more than 5 minutes. Ribs tender. Pain increases when he exhales.  Has the patient traveled out of the country within the last 30 days? ---No  Does the patient have any new or worsening symptoms? ---Yes  Will a triage be completed? ---Yes  Related visit to physician within the last 2 weeks? ---No  Does the PT have any chronic conditions? (i.e. diabetes, asthma, etc.) ---No  Is this a behavioral health or substance abuse call? ---No     Guidelines    Guideline Title Affirmed Question Affirmed Notes  Chest Pain [1] Chest pain lasts > 5 minutes AND [2] described as crushing, pressure-like, or heavy    Final Disposition User   Call EMS 911 Now Berneice GandyGrubb, RN, Autumn    Comments  Patient disagrees with 911 outcome, requests to make appt. Advised nurse suggests calling 911, pt understands. Advised will report to office and have someone call him back. Called backline and report given to Rena at 10:22 AM, [employer] computer updates at this time prevented entering notes until computer update complete. Rena advised she would call the patient back now.   Disagree/Comply: Disagree  Disagree/Comply Reason: Disagree with instructions

## 2015-03-31 NOTE — Progress Notes (Signed)
Pre visit review using our clinic review tool, if applicable. No additional management support is needed unless otherwise documented below in the visit note. 

## 2015-03-31 NOTE — Telephone Encounter (Signed)
Pt having CP intermittently for 1 -2 years but feels worse today; pt stated he cannot pop his sternum today. Sternal CP worse when moves lt arm toward rt hip or tries to pick up something, pain level 5-6 upon movement; also sore spot lt of sternum when presses on it;if sitting or standing still no pain. Pt scheduled appt 03/31/15 at 12 noon; Dr Reece AgarG is aware.if pt condition changes or worsens prior to appt pt will cb or go to ED.

## 2015-03-31 NOTE — Assessment & Plan Note (Signed)
Story/exam consistent with costochondritis. Discussed etiology. rec ice/heating pad and NSAID. Recommended chest wall gentle stretching. Update if no better, consider PT referral. Pt agrees with plan. He will use OTC NSAID.

## 2015-03-31 NOTE — Assessment & Plan Note (Signed)
Persistent, asxs.

## 2015-03-31 NOTE — Telephone Encounter (Signed)
Seen today. 

## 2015-03-31 NOTE — Patient Instructions (Addendum)
Stop phentermine.  I do think you have costochondritis. Treat with gentle chest wall stretching, ice or heating pad (whichever soothes better) and ibuprofen 800mg  three times daily with food. Let us know if not better for PT referral.   Costochondritis Costochondritis, sometimes called Tietze syndrome, is a swelling and irritation (inflammation) of the tissue (cartilage) that connects your ribs with your breastbone (sternum). It causes pain in the chest and rib area. Costochondritis usually goes away on its own over time. It can take up to 6 weeks or longer to get better, especially if you are unable to limit your activities. CAUSES  Some cases of costochondritis have no known cause. Possible causes include:  Injury (trauma).  Exercise or activity such as lifting.  Severe coughing. SIGNS AND SYMPTOMS  Pain and tenderness in the chest and rib area.  Pain that gets worse when coughing or taking deep breaths.  Pain that gets worse with specific movements. DIAGNOSIS  Your health care provider will do a physical exam and ask about your symptoms. Chest X-rays or other tests may be done to rule out other problems. TREATMENT  Costochondritis usually goes away on its own over time. Your health care provider may prescribe medicine to help relieve pain. HOME CARE INSTRUCTIONS   Avoid exhausting physical activity. Try not to strain your ribs during normal activity. This would include any activities using chest, abdominal, and side muscles, especially if heavy weights are used.  Apply ice to the affected area for the first 2 days after the pain begins.  Put ice in a plastic bag.  Place a towel between your skin and the bag.  Leave the ice on for 20 minutes, 2-3 times a day.  Only take over-the-counter or prescription medicines as directed by your health care provider. SEEK MEDICAL CARE IF:  You have redness or swelling at the rib joints. These are signs of infection.  Your pain does not  go away despite rest or medicine. SEEK IMMEDIATE MEDICAL CARE IF:   Your pain increases or you are very uncomfortable.  You have shortness of breath or difficulty breathing.  You cough up blood.  You have worse chest pains, sweating, or vomiting.  You have a fever or persistent symptoms for more than 2-3 days.  You have a fever and your symptoms suddenly get worse. MAKE SURE YOU:   Understand these instructions.  Will watch your condition.  Will get help right away if you are not doing well or get worse.   This information is not intended to replace advice given to you by your health care provider. Make sure you discuss any questions you have with your health care provider.   Document Released: 01/17/2005 Document Revised: 01/28/2013 Document Reviewed: 11/11/2012 Elsevier Interactive Patient Education [employer]2016 [employer]

## 2015-03-31 NOTE — Telephone Encounter (Signed)
Malachi BondsIda with [employer] called; [facility] are [facility]; pt called with CP. [employer] are up and [facility].

## 2015-04-01 ENCOUNTER — Ambulatory Visit: Payer: 59 | Admitting: Family Medicine

## 2015-04-06 ENCOUNTER — Encounter: Payer: Self-pay | Admitting: Family Medicine

## 2015-04-06 ENCOUNTER — Ambulatory Visit (INDEPENDENT_AMBULATORY_CARE_PROVIDER_SITE_OTHER): Payer: 59 | Admitting: Family Medicine

## 2015-04-06 VITALS — BP 112/68 | HR 80 | Temp 98.4°F | Wt 292.1 lb

## 2015-04-06 DIAGNOSIS — J069 Acute upper respiratory infection, unspecified: Secondary | ICD-10-CM

## 2015-04-06 DIAGNOSIS — J020 Streptococcal pharyngitis: Secondary | ICD-10-CM | POA: Diagnosis not present

## 2015-04-06 LAB — POCT RAPID STREP A (OFFICE): Rapid Strep A Screen: NEGATIVE

## 2015-04-06 NOTE — Patient Instructions (Signed)
Nice to meet you. Your symptoms are likely related to a viral upper respiratory infection. A rapid strep test is negative. We will send a swab for culture for strep. You can take Zyrtec 10 mg daily and Flonase 2 sprays in each nostril daily while you have this illness. You stay well hydrated with this. If you develop fever, shortness of breath, productive cough, blood in your cough, or new or worsening symptoms please seek medical attention.

## 2015-04-06 NOTE — Progress Notes (Signed)
Patient ID: Victor Zimmerman, male   DOB: 07/19/1985, 29 y.o.   MRN: [id number]  Marikay AlarEric Sonnenberg, MD Phone: [phone]  Victor QuinceMichael Foerster is a 29 y.o. male who presents today for same-day visit.  Sore throat: Patient notes starting yesterday he developed sore throat with mild rhinorrhea and fair amount of nasal congestion. He notes some pressure in his right ear. He's had some minimal cough, though no productive cough. No shortness of breath. He felt fatigued last night. He has felt feverish, though has not checked his temperature. He is not taking any medicines for it. He notes the contacts in the house with most of his kids and wife with similar symptoms.  PMH: nonsmoker.   ROS see history of present illness  Objective  Physical Exam Filed Vitals:   04/06/15 1403  BP: 112/68  Pulse: 80  Temp: 98.4 F (36.9 C)    Physical Exam  Constitutional: He is well-developed, well-nourished, and in no distress.  HENT:  Head: Normocephalic and atraumatic.  Right Ear: External ear normal.  Left Ear: External ear normal.  Normal TMs bilaterally, mild posterior oropharyngeal erythema and postnasal drip, no exudates, 1+ tonsils equal in size  Eyes: Conjunctivae are normal. Pupils are equal, round, and reactive to light.  Neck: Neck supple.  Cardiovascular: Normal rate, regular rhythm and normal heart sounds.  Exam reveals no gallop and no friction rub.   No murmur heard. Pulmonary/Chest: Effort normal and breath sounds normal. No respiratory distress. He has no wheezes. He has no rales.  Lymphadenopathy:    He has no cervical adenopathy.  Skin: Skin is warm and dry. He is not diaphoretic.     Assessment/Plan: Please see individual problem list.  Viral URI Symptoms are most consistent with viral upper respiratory infection. He had a negative rapid strep test in the office. Given his history we'll send strep culture. I discussed supportive care with him. Advised on Zyrtec and Flonase use.  Advised he stay well hydrated. Given return precautions.    Orders Placed This Encounter  Procedures  . Throat [facility]  . POCT rapid strep A   Dragon voice recognition software was used during the dictation process of this note. If any phrases or words seem inappropriate it is likely secondary to the translation process being inefficient.  Marikay AlarEric Sonnenberg

## 2015-04-06 NOTE — Progress Notes (Signed)
Pre visit review using our clinic review tool, if applicable. No additional management support is needed unless otherwise documented below in the visit note. 

## 2015-04-06 NOTE — Assessment & Plan Note (Signed)
Symptoms are most consistent with viral upper respiratory infection. He had a negative rapid strep test in the office. Given his history we'll send strep culture. I discussed supportive care with him. Advised on Zyrtec and Flonase use. Advised he stay well hydrated. Given return precautions.

## 2015-04-08 LAB — CULTURE, GROUP A STREP: Organism ID, Bacteria: NORMAL

## 2015-05-18 ENCOUNTER — Other Ambulatory Visit: Payer: Self-pay | Admitting: Family Medicine

## 2015-05-18 NOTE — Telephone Encounter (Signed)
Ok to refill 

## 2015-05-19 NOTE — Telephone Encounter (Signed)
Rx called in as directed.   

## 2015-05-19 NOTE — Telephone Encounter (Signed)
plz phone in. 

## 2015-06-24 ENCOUNTER — Ambulatory Visit (INDEPENDENT_AMBULATORY_CARE_PROVIDER_SITE_OTHER): Payer: BLUE CROSS/BLUE SHIELD | Admitting: Family Medicine

## 2015-06-24 ENCOUNTER — Encounter: Payer: Self-pay | Admitting: Family Medicine

## 2015-06-24 VITALS — BP 118/82 | HR 64 | Temp 97.4°F | Wt 294.8 lb

## 2015-06-24 DIAGNOSIS — K219 Gastro-esophageal reflux disease without esophagitis: Secondary | ICD-10-CM | POA: Diagnosis not present

## 2015-06-24 DIAGNOSIS — Z6379 Other stressful life events affecting family and household: Secondary | ICD-10-CM

## 2015-06-24 MED ORDER — HYDROXYZINE HCL 25 MG PO TABS: 12.5000 mg | ORAL_TABLET | Freq: Three times a day (TID) | ORAL | Status: DC | PRN

## 2015-06-24 MED ORDER — OMEPRAZOLE 40 MG PO CPDR: 40.0000 mg | DELAYED_RELEASE_CAPSULE | Freq: Every day | ORAL | Status: DC

## 2015-06-24 NOTE — Patient Instructions (Addendum)
Hang in there!  Avoid ibuprofen.  Avoidance of citrus, fatty foods, chocolate, peppermint, and excessive alcohol, along with sodas, orange juice (acidic drinks).  At least a few hours between dinner and bed, minimize naps after eating.  Try omeprazole  daily for 3 weeks for these symptoms.  Ok to continue pepto bismol.  Try hydroxyzine as needed for anxiety start at 1/2 tablet daily.   Gastroesophageal Reflux Disease, Adult Normally, food travels down the esophagus and stays in the stomach to be digested. However, when a person has gastroesophageal reflux disease (GERD), food and stomach acid move back up into the esophagus. When this happens, the esophagus becomes sore and inflamed. Over time, GERD can create small holes (ulcers) in the lining of the esophagus.  CAUSES This condition is caused by a problem with the muscle between the esophagus and the stomach (lower esophageal sphincter, or LES). Normally, the LES muscle closes after food passes through the esophagus to the stomach. When the LES is weakened or abnormal, it does not close properly, and that allows food and stomach acid to go back up into the esophagus. The LES can be weakened by certain dietary substances, medicines, and medical conditions, including:  Tobacco use.  Pregnancy.  Having a hiatal hernia.  Heavy alcohol use.  Certain foods and beverages, such as coffee, chocolate, onions, and peppermint. RISK FACTORS This condition is more likely to develop in:  People who have an increased body weight.  People who have connective tissue disorders.  People who use NSAID medicines. SYMPTOMS Symptoms of this condition include:  Heartburn.  Difficult or painful swallowing.  The feeling of having a lump in the throat.  Abitter taste in the mouth.  Bad breath.  Having a large amount of saliva.  Having an upset or bloated stomach.  Belching.  Chest pain.  Shortness of breath or wheezing.  Ongoing  (chronic) cough or a night-time cough.  Wearing away of tooth enamel.  Weight loss. Different conditions can cause chest pain. Make sure to see your health care provider if you experience chest pain. DIAGNOSIS Your health care provider will take a medical history and perform a physical exam. To determine if you have mild or severe GERD, your health care provider may also monitor how you respond to treatment. You may also have other tests, including:  An endoscopy toexamine your stomach and esophagus with a small camera.  A test thatmeasures the acidity level in your esophagus.  A test thatmeasures how much pressure is on your esophagus.  A barium swallow or modified barium swallow to show the shape, size, and functioning of your esophagus. TREATMENT The goal of treatment is to help relieve your symptoms and to prevent complications. Treatment for this condition may vary depending on how severe your symptoms are. Your health care provider may recommend:  Changes to your diet.  Medicine.  Surgery. HOME CARE INSTRUCTIONS Diet  Follow a diet as recommended by your health care provider. This may involve avoiding foods and drinks such as:  Coffee and tea (with or without caffeine).  Drinks that containalcohol.  Energy drinks and sports drinks.  Carbonated drinks or sodas.  Chocolate and cocoa.  Peppermint and mint flavorings.  Garlic and onions.  Horseradish.  Spicy and acidic foods, including peppers, chili powder, curry powder, vinegar, hot sauces, and barbecue sauce.  Citrus fruit juices and citrus fruits, such as oranges, lemons, and limes.  Tomato-based foods, such as red sauce, chili, salsa, and pizza with red sauce.  Fried and fatty foods, such as donuts, french fries, potato chips, and high-fat dressings.  High-fat meats, such as hot dogs and fatty cuts of red and white meats, such as rib eye steak, sausage, ham, and bacon.  High-fat dairy items, such as  whole milk, butter, and cream cheese.  Eat small, frequent meals instead of large meals.  Avoid drinking large amounts of liquid with your meals.  Avoid eating meals during the 2-3 hours before bedtime.  Avoid lying down right after you eat.  Do not exercise right after you eat. General Instructions  Pay attention to any changes in your symptoms.  Take over-the-counter and prescription medicines only as told by your health care provider. Do not take aspirin, ibuprofen, or other NSAIDs unless your health care provider told you to do so.  Do not use any tobacco products, including cigarettes, chewing tobacco, and e-cigarettes. If you need help quitting, ask your health care provider.  Wear loose-fitting clothing. Do not wear anything tight around your waist that causes pressure on your abdomen.  Raise (elevate) the head of your bed 6 inches (15cm).  Try to reduce your stress, such as with yoga or meditation. If you need help reducing stress, ask your health care provider.  If you are overweight, reduce your weight to an amount that is healthy for you. Ask your health care provider for guidance about a safe weight loss goal.  Keep all follow-up visits as told by your health care provider. This is important. SEEK MEDICAL CARE IF:  You have new symptoms.  You have unexplained weight loss.  You have difficulty swallowing, or it hurts to swallow.  You have wheezing or a persistent cough.  Your symptoms do not improve with treatment.  You have a hoarse voice. SEEK IMMEDIATE MEDICAL CARE IF:  You have pain in your arms, neck, jaw, teeth, or back.  You feel sweaty, dizzy, or light-headed.  You have chest pain or shortness of breath.  You vomit and your vomit looks like blood or coffee grounds.  You faint.  Your stool is bloody or black.  You cannot swallow, drink, or eat.   This information is not intended to replace advice given to you by your health care provider.  Make sure you discuss any questions you have with your health care provider.   Document Released: 01/17/2005 Document Revised: 12/29/2014 Document Reviewed: 08/04/2014 Elsevier Interactive Patient Education [employer]2016 Elsevier Inc.

## 2015-06-24 NOTE — Progress Notes (Signed)
Pre visit review using our clinic review tool, if applicable. No additional management support is needed unless otherwise documented below in the visit note. 

## 2015-06-24 NOTE — Assessment & Plan Note (Signed)
Support provided Discussed stress relieving strategies - encouraged exercise regimen Provided with hydroxyzine PRN anxiety, discussed use. Pt requests avoiding controlled substances.

## 2015-06-24 NOTE — Assessment & Plan Note (Addendum)
Possible small HH. No red flags Anticipate worsened due to stress Discussed diet changes to control symptoms. Treat with omeprazoe 40mg  daily x 3 wks then PRN. Update with effect.
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
## 2015-07-05 ENCOUNTER — Ambulatory Visit (INDEPENDENT_AMBULATORY_CARE_PROVIDER_SITE_OTHER): Payer: BLUE CROSS/BLUE SHIELD | Admitting: Internal Medicine

## 2015-07-05 ENCOUNTER — Encounter: Payer: Self-pay | Admitting: Internal Medicine

## 2015-07-05 VITALS — BP 112/74 | HR 88 | Temp 98.2°F | Wt 291.0 lb

## 2015-07-05 DIAGNOSIS — R6883 Chills (without fever): Secondary | ICD-10-CM | POA: Diagnosis not present

## 2015-07-05 DIAGNOSIS — J069 Acute upper respiratory infection, unspecified: Secondary | ICD-10-CM | POA: Diagnosis not present

## 2015-07-05 LAB — POCT INFLUENZA A/B
Influenza A, POC: NEGATIVE
Influenza B, POC: NEGATIVE

## 2015-07-05 NOTE — Patient Instructions (Signed)
Upper Respiratory Infection, Adult Most upper respiratory infections (URIs) are a viral infection of the air passages leading to the lungs. A URI affects the nose, throat, and upper air passages. The most common type of URI is nasopharyngitis and is typically referred to as "the common cold." URIs run their course and usually go away on their own. Most of the time, a URI does not require medical attention, but sometimes a bacterial infection in the upper airways can follow a viral infection. This is called a secondary infection. Sinus and middle ear infections are common types of secondary upper respiratory infections. Bacterial pneumonia can also complicate a URI. A URI can worsen asthma and chronic obstructive pulmonary disease (COPD). Sometimes, these complications can require emergency medical care and may be life threatening.  CAUSES Almost all URIs are caused by viruses. A virus is a type of germ and can spread from one person to another.  RISKS FACTORS You may be at risk for a URI if:   You smoke.   You have chronic heart or lung disease.  You have a weakened defense (immune) system.   You are very young or very old.   You have nasal allergies or asthma.  You work in crowded or poorly ventilated areas.  You work in health care facilities or schools. SIGNS AND SYMPTOMS  Symptoms typically develop 2-3 days after you come in contact with a cold virus. Most viral URIs last 7-10 days. However, viral URIs from the influenza virus (flu virus) can last 14-18 days and are typically more severe. Symptoms may include:   Runny or stuffy (congested) nose.   Sneezing.   Cough.   Sore throat.   Headache.   Fatigue.   Fever.   Loss of appetite.   Pain in your forehead, behind your eyes, and over your cheekbones (sinus pain).  Muscle aches.  DIAGNOSIS  Your health care provider may diagnose a URI by:  Physical exam.  Tests to check that your symptoms are not due to  another condition such as:  Strep throat.  Sinusitis.  Pneumonia.  Asthma. TREATMENT  A URI goes away on its own with time. It cannot be cured with medicines, but medicines may be prescribed or recommended to relieve symptoms. Medicines may help:  Reduce your fever.  Reduce your cough.  Relieve nasal congestion. HOME CARE INSTRUCTIONS   Take medicines only as directed by your health care provider.   Gargle warm saltwater or take cough drops to comfort your throat as directed by your health care provider.  Use a warm mist humidifier or inhale steam from a shower to increase air moisture. This may make it easier to breathe.  Drink enough fluid to keep your urine clear or pale yellow.   Eat soups and other clear broths and maintain good nutrition.   Rest as needed.   Return to work when your temperature has returned to normal or as your health care provider advises. You may need to stay home longer to avoid infecting others. You can also use a face mask and careful hand washing to prevent spread of the virus.  Increase the usage of your inhaler if you have asthma.   Do not use any tobacco products, including cigarettes, chewing tobacco, or electronic cigarettes. If you need help quitting, ask your health care provider. PREVENTION  The best way to protect yourself from getting a cold is to practice good hygiene.   Avoid oral or hand contact with people with cold   symptoms.   Wash your hands often if contact occurs.  There is no clear evidence that vitamin C, vitamin E, echinacea, or exercise reduces the chance of developing a cold. However, it is always recommended to get plenty of rest, exercise, and practice good nutrition.  SEEK MEDICAL CARE IF:   You are getting worse rather than better.   Your symptoms are not controlled by medicine.   You have chills.  You have worsening shortness of breath.  You have brown or red mucus.  You have yellow or brown nasal  discharge.  You have pain in your face, especially when you bend forward.  You have a fever.  You have swollen neck glands.  You have pain while swallowing.  You have white areas in the back of your throat. SEEK IMMEDIATE MEDICAL CARE IF:   You have severe or persistent:  Headache.  Ear pain.  Sinus pain.  Chest pain.  You have chronic lung disease and any of the following:  Wheezing.  Prolonged cough.  Coughing up blood.  A change in your usual mucus.  You have a stiff neck.  You have changes in your:  Vision.  Hearing.  Thinking.  Mood. MAKE SURE YOU:   Understand these instructions.  Will watch your condition.  Will get help right away if you are not doing well or get worse.   This information is not intended to replace advice given to you by your health care provider. Make sure you discuss any questions you have with your health care provider.   Document Released: 10/03/2000 Document Revised: 08/24/2014 Document Reviewed: 07/15/2013 Elsevier Interactive Patient Education 2016 Elsevier Inc.  

## 2015-07-05 NOTE — Progress Notes (Signed)
Pre visit review using our clinic review tool, if applicable. No additional management support is needed unless otherwise documented below in the visit note. 

## 2015-07-05 NOTE — Addendum Note (Signed)
Addended by: Roena MaladyEVONTENNO, Lenaya Pietsch Y on: 07/05/2015 04:47 PM   Modules accepted: Orders

## 2015-07-05 NOTE — Progress Notes (Signed)
HPI  Pt presents to the clinic today with c/o headache, facial pain and pressure, nasal congestion, ear fullness and cough. This started 2 days ago. He is blowing yellow mucous out of his nose. The cough is nonproductive. The cough seems worse at night. He denies fever, but has had chills. He has tried Mucinex, Astelin and Excedrin with minimal relief. He does have a history of seasonal allergies. He has had sick contacts diagnosed with the flu. He did not get his flu shot this year.  Review of Systems    Past Medical History  Diagnosis Date  . Seasonal allergic rhinitis     and dust  . Morbid obesity (HCC)   . Strep pharyngitis 01/21/2015    Family History  Problem Relation Age of Onset  . Diabetes Other     maternal side  . Hypertension Mother   . Cancer Maternal Uncle     brain  . Cancer Maternal Uncle     unknown  . Cancer Maternal Aunt     unsure  . CAD Neg Hx     Social History   Social History  . Marital Status: Married    Spouse Name: N/A  . Number of Children: N/A  . Years of Education: N/A   Occupational History  . Not on file.   Social History Main Topics  . Smoking status: Never Smoker   . Smokeless tobacco: Never Used  . Alcohol Use: No  . Drug Use: No  . Sexual Activity: Yes    Birth Control/ Protection: Condom   Other Topics Concern  . Not on file   Social History Narrative   Lives with wife, 5 children   Occupation: Arts administratorbay seller, starting own business   Edu: HS   Activity: no regular exercise   Diet: some water, vegetables daily    Allergies  Allergen Reactions  . Amoxicillin Other (See Comments)    All "cillin" Blisters in mouth     Constitutional: Positive headache. Denies fatigue, fever or abrupt weight changes.  HEENT:  Positive facial pain, nasal congestion and sore throat. Denies eye redness, ear pain, ringing in the ears, wax buildup, runny nose or bloody nose. Respiratory: Positive cough. Denies difficulty breathing or  shortness of breath.  Cardiovascular: Denies chest pain, chest tightness, palpitations or swelling in the hands or feet.   No other specific complaints in a complete review of systems (except as listed in HPI above).  Objective:  BP 112/74 mmHg  Pulse 88  Temp(Src) 98.2 F (36.8 C) (Oral)  Wt 291 lb (131.997 kg)  SpO2 98%   General: Appears his stated age,  in NAD. HEENT: Head: normal shape and size, no sinus tenderness noted; Eyes: sclera white, no icterus, conjunctiva pink; Ears: Tm's gray and intact, normal light reflex;  Throat/Mouth: + PND. Teeth present, mucosa erythematous and moist, no exudate noted, no lesions or ulcerations noted.  Neck:  No adenopathy noted.  Cardiovascular: Normal rate and rhythm. S1,S2 noted.  No murmur, rubs or gallops noted.  Pulmonary/Chest: Normal effort and positive vesicular breath sounds. No respiratory distress. No wheezes, rales or ronchi noted.      Assessment & Plan:   Viral URI  Rapid flu: negative Can use a Neti Pot which can be purchased from your local drug store. Flonase 2 sprays each nostril for 3 days and then as needed. Salt water gargles and Ibuprofen for sore throat Delsym as needed for cough  RTC as needed or if symptoms persist.

## 2015-07-08 ENCOUNTER — Other Ambulatory Visit: Payer: Self-pay | Admitting: Family Medicine

## 2015-09-01 ENCOUNTER — Other Ambulatory Visit: Payer: Self-pay | Admitting: Family Medicine

## 2015-09-06 ENCOUNTER — Ambulatory Visit: Payer: BLUE CROSS/BLUE SHIELD | Admitting: Internal Medicine

## 2015-09-06 ENCOUNTER — Encounter: Payer: Self-pay | Admitting: Internal Medicine

## 2015-09-06 ENCOUNTER — Ambulatory Visit: Payer: BLUE CROSS/BLUE SHIELD | Admitting: Family Medicine

## 2015-09-06 ENCOUNTER — Ambulatory Visit (INDEPENDENT_AMBULATORY_CARE_PROVIDER_SITE_OTHER): Payer: BLUE CROSS/BLUE SHIELD | Admitting: Internal Medicine

## 2015-09-06 VITALS — BP 108/66 | HR 76 | Temp 98.3°F | Wt 293.0 lb

## 2015-09-06 DIAGNOSIS — J019 Acute sinusitis, unspecified: Secondary | ICD-10-CM

## 2015-09-06 DIAGNOSIS — B9689 Other specified bacterial agents as the cause of diseases classified elsewhere: Secondary | ICD-10-CM

## 2015-09-06 MED ORDER — AZITHROMYCIN 250 MG PO TABS: ORAL_TABLET | ORAL | Status: DC

## 2015-09-06 NOTE — Progress Notes (Signed)
HPI  Pt presents to the clinic today with c/o fatigue, headache, facial pain and pressure, nasal congestion, sore throat and cough. This started 5 days ago. He is blowing yellow mucous out of his nose. He denies difficulty swallowing. The cough is productive of yellow mucous. He denies fever but has had chills and body aches. He has tried Ibuprofen, Tylenol, Benadryl, Allegra, Astelin and Flonase with minimal relief. He does have a history of seasonal allergies. He has had sick contacts.  Review of Systems    Past Medical History  Diagnosis Date  . Seasonal allergic rhinitis     and dust  . Morbid obesity (HCC)   . Strep pharyngitis 01/21/2015    Family History  Problem Relation Age of Onset  . Diabetes Other     maternal side  . Hypertension Mother   . Cancer Maternal Uncle     brain  . Cancer Maternal Uncle     unknown  . Cancer Maternal Aunt     unsure  . CAD Neg Hx     Social History   Social History  . Marital Status: Married    Spouse Name: N/A  . Number of Children: N/A  . Years of Education: N/A   Occupational History  . Not on file.   Social History Main Topics  . Smoking status: Never Smoker   . Smokeless tobacco: Never Used  . Alcohol Use: No  . Drug Use: No  . Sexual Activity: Yes    Birth Control/ Protection: Condom   Other Topics Concern  . Not on file   Social History Narrative   Lives with wife, 5 children   Occupation: Arts administratorbay seller, starting own business   Edu: HS   Activity: no regular exercise   Diet: some water, vegetables daily    Allergies  Allergen Reactions  . Amoxicillin Other (See Comments)    All "cillin" Blisters in mouth     Constitutional: Positive headache, fatigue. Denies fever or abrupt weight changes.  HEENT:  Positive facial pain, nasal congestion and sore throat. Denies eye redness, ear pain, ringing in the ears, wax buildup, runny nose or bloody nose. Respiratory: Positive cough. Denies difficulty breathing or  shortness of breath.  Cardiovascular: Denies chest pain, chest tightness, palpitations or swelling in the hands or feet.   No other specific complaints in a complete review of systems (except as listed in HPI above).  Objective:  BP 108/66 mmHg  Pulse 76  Temp(Src) 98.3 F (36.8 C) (Oral)  Wt 293 lb (132.904 kg)  SpO2 98%   General: Appears his stated age, in NAD. HEENT: Head: normal shape and size, maxillary sinus tenderness noted; Eyes: sclera white, no icterus, conjunctiva pink; Ears: Tm's pink but intact, normal light reflex; Nose: mucosa boggy and moist, septum midline; Throat/Mouth: + PND. Teeth present, mucosa erythematous and moist, no exudate noted, no lesions or ulcerations noted.  Neck:  No adenopathy noted.  Cardiovascular: Normal rate and rhythm. S1,S2 noted.  No murmur, rubs or gallops noted.  Pulmonary/Chest: Normal effort and positive vesicular breath sounds. No respiratory distress. No wheezes, rales or ronchi noted.      Assessment & Plan:   Acute bacterial sinusitis  Can use a Neti Pot which can be purchased from your local drug store. Continue Astelin and Allegra Zithromax x 5  days  RTC as needed or if symptoms persist.

## 2015-09-06 NOTE — Patient Instructions (Signed)

## 2015-09-06 NOTE — Progress Notes (Signed)
Pre visit review using our clinic review tool, if applicable. No additional management support is needed unless otherwise documented below in the visit note. 

## 2015-10-26 ENCOUNTER — Other Ambulatory Visit: Payer: Self-pay | Admitting: Family Medicine

## 2015-12-29 ENCOUNTER — Other Ambulatory Visit: Payer: Self-pay | Admitting: Family Medicine

## 2015-12-30 ENCOUNTER — Other Ambulatory Visit: Payer: Self-pay | Admitting: *Deleted

## 2015-12-30 MED ORDER — PHENTERMINE HCL 30 MG PO CAPS: 30.0000 mg | ORAL_CAPSULE | Freq: Every day | ORAL | 0 refills | Status: DC

## 2015-12-30 NOTE — Telephone Encounter (Signed)
Ok to refill? Last filled 05/19/15 #30 3 RF

## 2015-12-30 NOTE — Telephone Encounter (Signed)
May phone in x1. OV prior to future refills.

## 2016-01-02 NOTE — Telephone Encounter (Signed)
Rx called in as directed.   

## 2016-03-07 ENCOUNTER — Encounter: Payer: Self-pay | Admitting: Family Medicine

## 2016-03-07 ENCOUNTER — Ambulatory Visit (INDEPENDENT_AMBULATORY_CARE_PROVIDER_SITE_OTHER): Payer: BLUE CROSS/BLUE SHIELD | Admitting: Family Medicine

## 2016-03-07 VITALS — BP 122/76 | HR 67 | Temp 98.0°F | Wt 313.0 lb

## 2016-03-07 DIAGNOSIS — J0110 Acute frontal sinusitis, unspecified: Secondary | ICD-10-CM

## 2016-03-07 MED ORDER — AZITHROMYCIN 250 MG PO TABS: ORAL_TABLET | ORAL | 0 refills | Status: DC

## 2016-03-07 NOTE — Progress Notes (Signed)
Pre visit review using our clinic review tool, if applicable. No additional management support is needed unless otherwise documented below in the visit note. 

## 2016-03-07 NOTE — Progress Notes (Signed)
SUBJECTIVE:  Victor Zimmerman is a 30 y.o. male who complains of coryza, congestion, sneezing and bilateral sinus pain for 8 days. He denies a history of anorexia and chest pain and denies a history of asthma. Patient has and denies smoke cigarettes.   Current Outpatient Prescriptions on File Prior to Visit  Medication Sig Dispense Refill  . acetaminophen (TYLENOL) 500 MG tablet Take 1,000 mg by mouth every 6 (six) hours as needed for pain.    Marland Kitchen. aspirin-acetaminophen-caffeine (EXCEDRIN MIGRAINE) 250-250-65 MG tablet Take 2 tablets by mouth every 6 (six) hours as needed for headache.    Marland Kitchen. azelastine (ASTELIN) 0.1 % nasal spray USE ONE (1) SPRAY IN EACH NOSTRIL TWO TIMES A DAY 30 mL 3  . buPROPion (WELLBUTRIN XL) 300 MG 24 hr tablet TAKE 1 TABLET BY MOUTH DAILY 30 tablet 11  . fexofenadine (ALLEGRA) 180 MG tablet Take 180 mg by mouth daily as needed. Reported on 07/05/2015    . fluticasone (FLONASE) 50 MCG/ACT nasal spray USE ONE SPRAY IN EACH NOSTRIL DAILY 16 g 3  . hydrOXYzine (ATARAX/VISTARIL) 25 MG tablet Take 0.5-1 tablets (12.5-25 mg total) by mouth 3 (three) times daily as needed for anxiety. 30 tablet 0  . ibuprofen (ADVIL,MOTRIN) 200 MG tablet Take 4 tablets (800 mg total) by mouth 3 (three) times daily as needed.    Marland Kitchen. omeprazole (PRILOSEC) 40 MG capsule TAKE ONE CAPSULE BY MOUTH DAILY 30 capsule 3   No current facility-administered medications on file prior to visit.     Allergies  Allergen Reactions  . Amoxicillin Other (See Comments)    All "cillin" Blisters in mouth    Past Medical History:  Diagnosis Date  . Morbid obesity (HCC)   . Seasonal allergic rhinitis    and dust  . Strep pharyngitis 01/21/2015    Past Surgical History:  Procedure Laterality Date  . NO PAST SURGERIES      Family History  Problem Relation Age of Onset  . Diabetes Other     maternal side  . Hypertension Mother   . Cancer Maternal Uncle     brain  . Cancer Maternal Uncle     unknown  .  Cancer Maternal Aunt     unsure  . CAD Neg Hx     Social History   Social History  . Marital status: Married    Spouse name: N/A  . Number of children: N/A  . Years of education: N/A   Occupational History  . Not on file.   Social History Main Topics  . Smoking status: Never Smoker  . Smokeless tobacco: Never Used  . Alcohol use No  . Drug use: No  . Sexual activity: Yes    Birth control/ protection: Condom   Other Topics Concern  . Not on file   Social History Narrative   Lives with wife, 5 children   Occupation: Arts administratorbay seller, starting own business   Edu: HS   Activity: no regular exercise   Diet: some water, vegetables daily   The PMH, PSH, Social History, Family History, Medications, and allergies have been reviewed [facility], and have been updated if relevant.  OBJECTIVE: BP 122/76   Pulse 67   Temp 98 F (36.7 C) (Oral)   Wt (!) 313 lb (142 kg)   SpO2 97%   BMI 41.30 kg/m   He appears well, vital signs are as noted. Ears normal.  Throat and pharynx normal.  Neck supple. No adenopathy in the neck. Nose  is congested. Sinuses  tender. The chest is clear, without wheezes or rales.  ASSESSMENT:  sinusitis  PLAN: Given duration and progression of symptoms, will treat for bacterial sinusitis.  Symptomatic therapy suggested: push fluids, rest and return office visit prn if symptoms persist or worsen. . Call or return to clinic prn if these symptoms worsen or fail to improve as anticipated.

## 2016-03-07 NOTE — Patient Instructions (Addendum)
Great to see you.  Make an appointment to see Dr. Patsy Lageropland on your way out.  Take antibiotic as directed.  Drink lots of fluids.    Treat sympotmatically with Mucinex, nasal saline irrigation, and Tylenol/Ibuprofen.   Also try an antihistamine/decongestant like claritin D or zyrtec D over the counter- two times a day as needed ( have to sign for them at pharmacy).   Try over the counter nasocort-start with 2 sprays per nostril per day...and then try to taper to 1 spray per nostril once symptoms improve.   You can use warm compresses.     Call if not improving as expected in 5-7 days.

## 2016-03-21 ENCOUNTER — Ambulatory Visit (INDEPENDENT_AMBULATORY_CARE_PROVIDER_SITE_OTHER)
Admission: RE | Admit: 2016-03-21 | Discharge: 2016-03-21 | Disposition: A | Discharge status: Home / Self Care | Payer: BLUE CROSS/BLUE SHIELD | Source: Ambulatory Visit | Attending: Family Medicine | Admitting: Family Medicine

## 2016-03-21 ENCOUNTER — Other Ambulatory Visit: Payer: Self-pay | Admitting: *Deleted

## 2016-03-21 ENCOUNTER — Encounter: Payer: Self-pay | Admitting: Family Medicine

## 2016-03-21 ENCOUNTER — Ambulatory Visit (INDEPENDENT_AMBULATORY_CARE_PROVIDER_SITE_OTHER): Payer: BLUE CROSS/BLUE SHIELD | Admitting: Family Medicine

## 2016-03-21 VITALS — BP 90/64 | HR 67 | Temp 98.0°F | Ht 73.25 in | Wt 305.5 lb

## 2016-03-21 DIAGNOSIS — M25532 Pain in left wrist: Secondary | ICD-10-CM

## 2016-03-21 NOTE — Progress Notes (Signed)
Pre visit review using our clinic review tool, if applicable. No additional management support is needed unless otherwise documented below in the visit note. 

## 2016-03-21 NOTE — Progress Notes (Signed)
Dr. Karleen HampshireSpencer T. Shakaria Raphael, MD, CAQ Sports [facility] [address] [place], [id number] Phone: [phone] Fax: [phone]  03/21/2016  Patient: Victor Zimmerman, MRN: [id number], DOB: 12/30/1985, 30 y.o.  Primary Physician:  Eustaquio BoydenJavier Gutierrez, MD   Chief Complaint  Patient presents with  . Wrist Pain    Left   Subjective:   Victor Zimmerman is a 30 y.o. very pleasant male patient who presents with the following:  Putting together some pipe and felt like something broke. Now it has been about 4 week, has not eased off at all. Can feel it in his wrist. Sometimes lifting heavy - 150 lbs at times.   At baseline, so I really bother him at all, more and he is twisting and rotating at the wrist. He did not have any bruising or swelling at the time of injury.  Past Medical History, Surgical History, Social History, Family History, Problem List, Medications, and Allergies have been reviewed and updated if relevant.  Patient Active Problem List   Diagnosis Date Noted  . GERD (gastroesophageal reflux disease) 06/24/2015  . Stressful life event affecting family 06/24/2015  . Costochondritis 03/31/2015  . Cold sensation of skin 01/26/2015  . Dizziness 08/12/2014  . PAC (premature atrial contraction) 06/10/2014  . Left knee pain 06/10/2014  . Seasonal allergic rhinitis   . Attention and concentration deficit 11/03/2013  . Obesity (BMI 35.0-39.9 without comorbidity)     Past Medical History:  Diagnosis Date  . Morbid obesity (HCC)   . Seasonal allergic rhinitis    and dust  . Strep pharyngitis 01/21/2015    Past Surgical History:  Procedure Laterality Date  . NO PAST SURGERIES      Social History   Social History  . Marital status: Married    Spouse name: N/A  . Number of children: N/A  . Years of education: N/A   Occupational History  . Not on file.   Social History Main Topics  . Smoking status: Never Smoker  . Smokeless tobacco:  Never Used  . Alcohol use No  . Drug use: No  . Sexual activity: Yes    Birth control/ protection: Condom   Other Topics Concern  . Not on file   Social History Narrative   Lives with wife, 5 children   Occupation: Arts administratorbay seller, starting own business   Edu: HS   Activity: no regular exercise   Diet: some water, vegetables daily    Family History  Problem Relation Age of Onset  . Diabetes Other     maternal side  . Hypertension Mother   . Cancer Maternal Uncle     brain  . Cancer Maternal Uncle     unknown  . Cancer Maternal Aunt     unsure  . CAD Neg Hx     Allergies  Allergen Reactions  . Amoxicillin Other (See Comments)    All "cillin" Blisters in mouth    Medication list reviewed and updated in full in [facility].  GEN: No fevers, chills. Nontoxic. Primarily MSK c/o today. MSK: Detailed in the HPI GI: tolerating PO intake without difficulty Neuro: No numbness, parasthesias, or tingling associated. Otherwise the pertinent positives of the ROS are noted above.   Objective:   BP 90/64   Pulse 67   Temp 98 F (36.7 C) (Oral)   Ht 6' 1.25" (1.861 m)   Wt (!) 305 lb 8 oz (138.6 kg)   BMI 40.03 kg/m  GEN: WDWN, NAD, Non-toxic, Alert & Oriented x 3 HEENT: Atraumatic, Normocephalic.  Ears and Nose: No external deformity. EXTR: No clubbing/cyanosis/edema NEURO: Normal gait.  PSYCH: Normally interactive. Conversant. Not depressed or anxious appearing.  Calm demeanor.   Hand: L Ecchymosis or edema: neg ROM wrist/hand/digits/elbow: full  Carpals, MCP's, digits: NT Distal Ulna and Radius: NT Supination lift test: pos Ecchymosis or edema: neg Cysts/nodules: neg Finkelstein's test: neg Snuffbox tenderness: neg Scaphoid tubercle: NT Hook of Hamate: NT Resisted supination: NT Full composite fist Grip, all digits: 5/5 str No tenosynovitis Axial load test: neg Phalen's: neg Tinel's: neg Atrophy: neg  Hand sensation: intact   Radiology: Dg  Wrist Complete Left  Result Date: 03/21/2016 CLINICAL DATA:  Left wrist pain.  No mentioned injury. EXAM: LEFT WRIST - COMPLETE 3+ VIEW COMPARISON:  None. FINDINGS: There is no evidence of fracture or dislocation. There is no evidence of arthropathy or other focal bone abnormality. Soft tissues are unremarkable. IMPRESSION: Negative. Electronically Signed   By: Marnee SpringJonathon  Watts M.D.   On: 03/21/2016 09:46     Assessment and Plan:   Left wrist pain - Plan: DG Wrist Complete Left  Most likely ligamentous derangement in the wrist on the left. I placed him in a cockup wrist splint, wear for 3 weeks. Continued altered activities and ice and anti-inflammatories.  Follow-up: 4 weeks if not better  Medications Discontinued During This Encounter  Medication Reason  . azithromycin (ZITHROMAX) 250 MG tablet Completed Course   Orders Placed This Encounter  Procedures  . DG Wrist Complete Left    Signed,  Aldyn Toon T. Elroy Schembri, MD     Medication List       Accurate as of 03/21/16  1:15 PM. Always use your most recent med list.          acetaminophen 500 MG tablet Commonly known as:  TYLENOL Take 1,000 mg by mouth every 6 (six) hours as needed for pain.   aspirin-acetaminophen-caffeine 250-250-65 MG tablet Commonly known as:  EXCEDRIN MIGRAINE Take 2 tablets by mouth every 6 (six) hours as needed for headache.   azelastine 0.1 % nasal spray Commonly known as:  ASTELIN USE ONE (1) SPRAY IN EACH NOSTRIL TWO TIMES A DAY   buPROPion 300 MG 24 hr tablet Commonly known as:  WELLBUTRIN XL TAKE 1 TABLET BY MOUTH DAILY   fexofenadine 180 MG tablet Commonly known as:  ALLEGRA Take 180 mg by mouth daily as needed. Reported on 07/05/2015   fluticasone 50 MCG/ACT nasal spray Commonly known as:  FLONASE USE ONE SPRAY IN EACH NOSTRIL DAILY   hydrOXYzine 25 MG tablet Commonly known as:  ATARAX/VISTARIL Take 0.5-1 tablets (12.5-25 mg total) by mouth 3 (three) times daily as needed for  anxiety.   ibuprofen 200 MG tablet Commonly known as:  ADVIL,MOTRIN Take 4 tablets (800 mg total) by mouth 3 (three) times daily as needed.   omeprazole 40 MG capsule Commonly known as:  PRILOSEC TAKE ONE CAPSULE BY MOUTH DAILY

## 2016-05-03 ENCOUNTER — Ambulatory Visit (INDEPENDENT_AMBULATORY_CARE_PROVIDER_SITE_OTHER): Payer: BLUE CROSS/BLUE SHIELD | Admitting: Family Medicine

## 2016-05-03 ENCOUNTER — Encounter: Payer: Self-pay | Admitting: Family Medicine

## 2016-05-03 ENCOUNTER — Encounter (INDEPENDENT_AMBULATORY_CARE_PROVIDER_SITE_OTHER): Payer: Self-pay

## 2016-05-03 VITALS — BP 124/84 | HR 105 | Temp 98.8°F | Wt 311.8 lb

## 2016-05-03 DIAGNOSIS — J020 Streptococcal pharyngitis: Secondary | ICD-10-CM | POA: Diagnosis not present

## 2016-05-03 DIAGNOSIS — Z20818 Contact with and (suspected) exposure to other bacterial communicable diseases: Secondary | ICD-10-CM | POA: Diagnosis not present

## 2016-05-03 LAB — POCT RAPID STREP A (OFFICE): Rapid Strep A Screen: POSITIVE — AB

## 2016-05-03 MED ORDER — AZITHROMYCIN 250 MG PO TABS: ORAL_TABLET | ORAL | 0 refills | Status: DC

## 2016-05-03 NOTE — Progress Notes (Signed)
BP 124/84   Pulse (!) 105   Temp 98.8 F (37.1 C) (Oral)   Wt (!) 311 lb 12 oz (141.4 kg)   SpO2 96%   BMI 40.85 kg/m    CC: ?strep Subjective:    Patient ID: Victor Zimmerman, male    DOB: 10/31/1985, 31 y.o.   MRN: [id number]  HPI: Victor Zimmerman is a 31 y.o. male presenting on 05/03/2016 for Sore Throat (+exposure)   Few day h/o ST, acutely worse yesterday and today. + chills, sweating. Mild cough. No swollen glands. Ear pressure. No significant congestion, abd pain, nausea.   Wife and 5 kids with strep. H/o recurrent strep - improved since he started dymista spray.   Relevant past medical, surgical, family and social history reviewed and updated as indicated. Interim medical history since our last visit reviewed. Allergies and medications reviewed and updated. Current Outpatient Prescriptions on File Prior to Visit  Medication Sig  . acetaminophen (TYLENOL) 500 MG tablet Take 1,000 mg by mouth every 6 (six) hours as needed for pain.  Marland Kitchen. aspirin-acetaminophen-caffeine (EXCEDRIN MIGRAINE) 250-250-65 MG tablet Take 2 tablets by mouth every 6 (six) hours as needed for headache.  Marland Kitchen. buPROPion (WELLBUTRIN XL) 300 MG 24 hr tablet TAKE 1 TABLET BY MOUTH DAILY  . fexofenadine (ALLEGRA) 180 MG tablet Take 180 mg by mouth daily as needed. Reported on 07/05/2015  . fluticasone (FLONASE) 50 MCG/ACT nasal spray USE ONE SPRAY IN EACH NOSTRIL DAILY  . hydrOXYzine (ATARAX/VISTARIL) 25 MG tablet Take 0.5-1 tablets (12.5-25 mg total) by mouth 3 (three) times daily as needed for anxiety.  Marland Kitchen. ibuprofen (ADVIL,MOTRIN) 200 MG tablet Take 4 tablets (800 mg total) by mouth 3 (three) times daily as needed.  Marland Kitchen. omeprazole (PRILOSEC) 40 MG capsule TAKE ONE CAPSULE BY MOUTH DAILY  . phentermine 30 MG capsule Take 30 mg by mouth daily.   No current facility-administered medications on file prior to visit.     Review of Systems Per HPI unless specifically indicated in ROS section     Objective:    BP  124/84   Pulse (!) 105   Temp 98.8 F (37.1 C) (Oral)   Wt (!) 311 lb 12 oz (141.4 kg)   SpO2 96%   BMI 40.85 kg/m   Wt Readings from Last 3 Encounters:  05/03/16 (!) 311 lb 12 oz (141.4 kg)  03/21/16 (!) 305 lb 8 oz (138.6 kg)  03/07/16 (!) 313 lb (142 kg)    Physical Exam  Constitutional: He appears well-developed and well-nourished. No distress.  HENT:  Head: Normocephalic and atraumatic.  Right Ear: Hearing, tympanic membrane, external ear and ear canal normal.  Left Ear: Hearing, tympanic membrane, external ear and ear canal normal.  Nose: Nose normal. No mucosal edema or rhinorrhea. Right sinus exhibits no maxillary sinus tenderness and no frontal sinus tenderness. Left sinus exhibits no maxillary sinus tenderness and no frontal sinus tenderness.  Mouth/Throat: Uvula is midline and mucous membranes are normal. Oropharyngeal exudate, posterior oropharyngeal edema and posterior oropharyngeal erythema present. No tonsillar abscesses.  Tonsillar enlargement with erythema and exudates present  Eyes: Conjunctivae and EOM are normal. Pupils are equal, round, and reactive to light. No scleral icterus.  Neck: Normal range of motion. Neck supple.  Cardiovascular: Normal rate, regular rhythm, normal heart sounds and intact distal pulses.   No murmur heard. Pulmonary/Chest: Effort normal and breath sounds normal. No respiratory distress. He has no wheezes. He has no rales.  Lymphadenopathy:    He has  no cervical adenopathy.  Skin: Skin is warm and dry. No rash noted.  Nursing note and vitals reviewed.  Results for orders placed or performed in visit on 05/03/16  POCT rapid strep A  Result Value Ref Range   Rapid Strep A Screen Positive (A) Negative      Assessment & Plan:   Problem List Items Addressed This Visit    Strep pharyngitis - Primary    Strep exposure. RST positive. Treat with zpack given PCN resistance. Update if not improving with treatment.  Pt agrees with plan.        Relevant Medications   azithromycin (ZITHROMAX) 250 MG tablet    Other Visit Diagnoses    Strep throat exposure       Relevant Orders   POCT rapid strep A (Completed)       Follow up plan: Return if symptoms worsen or fail to improve.  [name], MD

## 2016-05-03 NOTE — Patient Instructions (Signed)
You have acute streptococcal pharyngitis. Push fluids and plenty of rest. Treat with zpack.  May use ibuprofen for throat inflammation. Salt water gargles.  Suck on cold things like popsicles or warm things like herbal teas (whichever soothes the throat better).  Return if fever >101.5, worsening pain, or trouble opening/closing mouth, or hoarse voice.  Good to see you today, call clinic with questions.

## 2016-05-03 NOTE — Progress Notes (Signed)
Pre visit review using our clinic review tool, if applicable. No additional management support is needed unless otherwise documented below in the visit note. 

## 2016-05-03 NOTE — Assessment & Plan Note (Signed)
Strep exposure. RST positive. Treat with zpack given PCN resistance. Update if not improving with treatment.  Pt agrees with plan.

## 2016-05-05 ENCOUNTER — Ambulatory Visit (INDEPENDENT_AMBULATORY_CARE_PROVIDER_SITE_OTHER): Payer: BLUE CROSS/BLUE SHIELD | Admitting: Family

## 2016-05-05 ENCOUNTER — Encounter: Payer: Self-pay | Admitting: Family

## 2016-05-05 VITALS — BP 116/76 | HR 80 | Temp 98.1°F | Ht 73.25 in | Wt 318.1 lb

## 2016-05-05 DIAGNOSIS — J020 Streptococcal pharyngitis: Secondary | ICD-10-CM

## 2016-05-05 MED ORDER — CLINDAMYCIN HCL 300 MG PO CAPS: 300.0000 mg | ORAL_CAPSULE | Freq: Three times a day (TID) | ORAL | 0 refills | Status: DC

## 2016-05-05 NOTE — Patient Instructions (Addendum)
Stop azithromycin.  Start clindamycin - 10 day course  Let us know it not better  Probiotics  If there is no improvement in your symptoms, or if there is any worsening of symptoms, or if you have any additional concerns, please return for re-evaluation; or, if we are closed, consider going to the Emergency Room for evaluation if symptoms urgent.

## 2016-05-05 NOTE — Progress Notes (Signed)
Pre visit review using our clinic review tool, if applicable. No additional management support is needed unless otherwise documented below in the visit note. 

## 2016-05-05 NOTE — Progress Notes (Signed)
Medication has been refilled and sent to correct pharmacy.

## 2016-05-05 NOTE — Progress Notes (Signed)
Subjective:    Patient ID: Victor Zimmerman, male    DOB: 02/06/1986, 31 y.o.   MRN: [id number]  CC: Victor Zimmerman is a 31 y.o. male who presents today for an acute visit.    HPI: CC: sore throat x 3 days, worsening. Glands swollen, some cough, SOB ( intermittent) when coughing. Bilateral ear pressure, sinus pressure, nasal congestion. Drinking milk, eating soup.  Started on azithromycin 2 days ago. Started  Mucinex, 800mg  ibuprofen with some relief.    No lung disease. Nonsmoker         HISTORY:  Past Medical History:  Diagnosis Date  . Morbid obesity (HCC)   . Seasonal allergic rhinitis    and dust  . Strep pharyngitis 01/21/2015   Past Surgical History:  Procedure Laterality Date  . NO PAST SURGERIES     Family History  Problem Relation Age of Onset  . Diabetes Other     maternal side  . Hypertension Mother   . Cancer Maternal Uncle     brain  . Cancer Maternal Uncle     unknown  . Cancer Maternal Aunt     unsure  . CAD Neg Hx     Allergies: Amoxicillin Current Outpatient Prescriptions on File Prior to Visit  Medication Sig Dispense Refill  . acetaminophen (TYLENOL) 500 MG tablet Take 1,000 mg by mouth every 6 (six) hours as needed for pain.    Marland Kitchen. aspirin-acetaminophen-caffeine (EXCEDRIN MIGRAINE) 250-250-65 MG tablet Take 2 tablets by mouth every 6 (six) hours as needed for headache.    Marland Kitchen. azelastine (ASTELIN) 0.1 % nasal spray Place 2 sprays into both nostrils 2 (two) times daily. Use in each nostril as directed    . buPROPion (WELLBUTRIN XL) 300 MG 24 hr tablet TAKE 1 TABLET BY MOUTH DAILY 30 tablet 11  . fexofenadine (ALLEGRA) 180 MG tablet Take 180 mg by mouth daily as needed. Reported on 07/05/2015    . fluticasone (FLONASE) 50 MCG/ACT nasal spray USE ONE SPRAY IN EACH NOSTRIL DAILY 16 g 3  . hydrOXYzine (ATARAX/VISTARIL) 25 MG tablet Take 0.5-1 tablets (12.5-25 mg total) by mouth 3 (three) times daily as needed for anxiety. 30 tablet 0  . ibuprofen  (ADVIL,MOTRIN) 200 MG tablet Take 4 tablets (800 mg total) by mouth 3 (three) times daily as needed.    Ailene Ards. Omega 3-6-9 Fatty Acids (OMEGA DHA PO) Take 3 capsules by mouth daily.    Marland Kitchen. omeprazole (PRILOSEC) 40 MG capsule TAKE ONE CAPSULE BY MOUTH DAILY 30 capsule 3  . phentermine 30 MG capsule Take 30 mg by mouth daily.     No current facility-administered medications on file prior to visit.     Social History  Substance Use Topics  . Smoking status: Never Smoker  . Smokeless tobacco: Never Used  . Alcohol use No    Review of Systems  Constitutional: Negative for chills and fever.  HENT: Positive for congestion, sinus pressure, sore throat and trouble swallowing. Negative for ear pain.   Respiratory: Positive for cough and shortness of breath. Negative for wheezing.   Cardiovascular: Negative for chest pain and palpitations.  Gastrointestinal: Negative for nausea and vomiting.      Objective:    BP 116/76   Pulse 80   Temp 98.1 F (36.7 C) (Oral)   Ht 6' 1.25" (1.861 m)   Wt (!) 318 lb 1.3 oz (144.3 kg)   SpO2 98%   BMI 41.68 kg/m    Physical Exam  Constitutional: Vital  signs are normal. He appears well-developed and well-nourished.  HENT:  Head: Normocephalic and atraumatic.  Right Ear: Hearing, tympanic membrane, external ear and ear canal normal. No drainage, swelling or tenderness. Tympanic membrane is not injected, not erythematous and not bulging. No middle ear effusion. No decreased hearing is noted.  Left Ear: Hearing, tympanic membrane, external ear and ear canal normal. No drainage, swelling or tenderness. Tympanic membrane is not injected, not erythematous and not bulging.  No middle ear effusion. No decreased hearing is noted.  Nose: Nose normal. Right sinus exhibits no maxillary sinus tenderness and no frontal sinus tenderness. Left sinus exhibits no maxillary sinus tenderness and no frontal sinus tenderness.  Mouth/Throat: Uvula is midline and mucous membranes  are normal. No trismus in the jaw. Oropharyngeal exudate and posterior oropharyngeal erythema present. No posterior oropharyngeal edema or tonsillar abscesses.    White exudate right tonsil.  Tonsil 2/4 bilaterally.  Eyes: Conjunctivae are normal.  Cardiovascular: Regular rhythm and normal heart sounds.   Pulmonary/Chest: Effort normal and breath sounds normal. No respiratory distress. He has no wheezes. He has no rhonchi. He has no rales.  Lymphadenopathy:       Head (right side): Tonsillar adenopathy present. No submental, no submandibular, no preauricular, no posterior auricular and no occipital adenopathy present.       Head (left side): Tonsillar adenopathy present. No submental, no submandibular, no preauricular, no posterior auricular and no occipital adenopathy present.    He has no cervical adenopathy.  Neurological: He is alert.  Skin: Skin is warm and dry.  Psychiatric: He has a normal mood and affect. His speech is normal and behavior is normal.  Vitals reviewed.      Assessment & Plan:  1. Strep pharyngitis Aebfrile. Stop azithromycin as no improvement. Start clindamycin. Encouraged probiotics.   - clindamycin (CLEOCIN) 300 MG capsule; Take 1 capsule (300 mg total) by mouth 3 (three) times daily.  Dispense: 30 capsule; Refill: 0     I have discontinued Victor Zimmerman azithromycin. I am also having him maintain his acetaminophen, fexofenadine, aspirin-acetaminophen-caffeine, ibuprofen, hydrOXYzine, buPROPion, fluticasone, omeprazole, phentermine, azelastine, Omega 3-6-9 Fatty Acids (OMEGA DHA PO), and clindamycin.   Meds ordered this encounter  Medications  . DISCONTD: clindamycin (CLEOCIN) 300 MG capsule    Sig: Take 1 capsule (300 mg total) by mouth 3 (three) times daily.    Dispense:  30 capsule    Refill:  0    Order Specific Question:   Supervising Provider    Answer:   Duncan Dull L [[id number]]  . clindamycin (CLEOCIN) 300 MG capsule    Sig: Take 1 capsule (300 mg  total) by mouth 3 (three) times daily.    Dispense:  30 capsule    Refill:  0    Return precautions given.   Risks, benefits, and alternatives of the medications and treatment plan prescribed today were discussed, and patient expressed understanding.   Education regarding symptom management and diagnosis given to patient on AVS.  Continue to follow with [name], MD for routine health maintenance.   Victor Zimmerman and I agreed with plan.   Rennie Plowman, FNP

## 2016-05-07 ENCOUNTER — Telehealth: Payer: Self-pay | Admitting: Family Medicine

## 2016-05-07 NOTE — Telephone Encounter (Signed)
Patient Name: Victor Zimmerman Gender: Male DOB: 03/25/1986 Age: [age] Y 1 M 5 D Return Phone Number: [phone] (Primary) Address: City/State/Zip: [place] Client [facility] - Client Client Site [facility] Night Physician Eustaquio BoydenGutierrez, Javier - MD Contact Type Call Who Is Calling Patient / Member / Family / Caregiver Call Type Triage / Clinical Caller Name [facility] Relationship To Patient Spouse Return Phone Number [phone] (Primary) Chief Complaint Sore Throat Reason for Call Symptomatic / Request for Health Information Initial Comment Caller states, has spoken with and RN [facility] No answer on either line for the office for scheduling. Patient is still having breathing issues along with fever, stomach ache and sore throat. PreDisposition Call Doctor Translation No Nurse Assessment Nurse: Charna Elizabethrumbull, RN, Lynden Angathy Date/Time (Eastern Time): 05/05/2016 10:07:43 AM Confirm and document reason for call. If symptomatic, describe symptoms. ---Caller states Casimiro NeedleMichael is not having severe breathing difficulty. He was diagnosed with Strep Throat two days ago, started on antibiotics and his sore throat symptoms are worse today. No severe swallowing difficulty. He developed moderate grade fever two days ago. Alert and responsive. Does the patient have any new or worsening symptoms? ---Yes Will a triage be completed? ---Yes Related visit to physician within the last 2 weeks? ---Yes Does the PT have any chronic conditions? (i.e. diabetes, asthma, etc.) ---Yes List chronic conditions. ---Strep Throat Is this a behavioral health or substance abuse call? ---No Guidelines Guideline Title Affirmed Question Affirmed Notes Nurse Date/Time Lamount Cohen(Eastern Time) Strep Throat Infection on Antibiotic Follow-up Call [1] Taking antibiotic > 24 hours for strep throat AND [2] sore throat pain is SEVERE Trumbull, RN, Lynden AngCathy 05/05/2016  10:10:30 AM PLEASE NOTE: All timestamps contained within this report are represented as [place] Standard Time. CONFIDENTIALTY NOTICE: This fax transmission is intended only for the addressee. It contains information that is legally privileged, confidential or otherwise protected from use or disclosure. If you are not the intended recipient, you are strictly prohibited from reviewing, disclosing, copying using or disseminating any of this information or taking any action in reliance on or regarding this information. If you have received this fax in error, please notify us immediately by telephone so that we can arrange for its return to us. Phone: [phone], Toll-Free: [phone], Fax: [phone] Page: 2 of 2 Call Id: [id number] Disp. Time Lamount Cohen(Eastern Time) Disposition Final User 05/05/2016 10:05:11 AM Send to Urgent Queue Terrilee FilesWilder, Lindsey 05/05/2016 10:13:01 AM See Physician within 24 Hours Yes Trumbull, RN, EMCORCathy

## 2016-05-07 NOTE — Telephone Encounter (Signed)
Patient Name: Victor QuinceMICHAEL Sevcik Gender: Male DOB: 10/06/1985 Age: 31 Y 1 M 5 D Return Phone Number: [phone] (Primary), [phone] (Secondary), [phone] (Alternate) Address: City/State/Zip: [place] Client [facility] - Client Client [facility] Day Physician Eustaquio BoydenGutierrez, Javier - MD Contact Type Call Who Is Calling Patient / Member / Family / Caregiver Call Type Triage / Clinical Caller Name Yisroel RammingMary Cieslik Relationship To Patient Spouse Return Phone Number [phone] (Primary) Chief Complaint BREATHING - shortness of breath or sounds breathless Reason for Call Symptomatic / Request for Health Information Initial Comment Caller's husband has Strep, Dr. gave him Z-pac but it hasn't been helping, can't take Amoxicillin. Symptoms are having trouble breathing (shortness of breath), bad sore throat, headache, stomach ache, fever of 101. Appointment Disposition EMR Appointment Not Necessary Info pasted into Epic No PreDisposition Call Doctor Translation No Nurse Assessment Nurse: Scarlette ArStandifer, RN, Heather Date/Time (Eastern Time): 05/05/2016 9:47:13 AM Confirm and document reason for call. If symptomatic, describe symptoms. ---Caller's husband has Strep on Thursday, Dr. gave him Z-pak but it hasn't been helping, can't take Amoxicillin. Symptoms are having trouble breathing (shortness of breath), bad sore throat, headache, stomach ache, fever of 101. Does the patient have any new or worsening symptoms? ---Yes Will a triage be completed? ---Yes Related visit to physician within the last 2 weeks? ---Yes Does the PT have any chronic conditions? (i.e. diabetes, asthma, etc.) ---No Is this a behavioral health or substance abuse call? ---No Guidelines Guideline Title Affirmed Question Affirmed Notes Nurse Date/Time Lamount Cohen(Eastern Time) Strep Throat Infection on Antibiotic Follow-up Call [1] Taking antibiotic > 24 hours for strep throat AND  [2] sore throat pain is SEVERE Standifer, RN, Heather 05/05/2016 9:48:21 AM PLEASE NOTE: All timestamps contained within this report are represented as [place] Standard Time. CONFIDENTIALTY NOTICE: This fax transmission is intended only for the addressee. It contains information that is legally privileged, confidential or otherwise protected from use or disclosure. If you are not the intended recipient, you are strictly prohibited from reviewing, disclosing, copying using or disseminating any of this information or taking any action in reliance on or regarding this information. If you have received this fax in error, please notify us immediately by telephone so that we can arrange for its return to us. Phone: [phone], Toll-Free: [phone], Fax: [phone] Page: 2 of 2 Call Id: [id number] Disp. Time Lamount Cohen(Eastern Time) Disposition Final User 05/05/2016 9:44:53 AM Send to Urgent Queue Eldred Mangesaul, Brian 05/05/2016 9:53:29 AM Call Completed Standifer, RN, Herbert SetaHeather 05/05/2016 9:52:46 AM See Physician within 24 Hours Yes Standifer, RN, Administrator, Civil ServiceHeather Caller Understands: Yes Disagree/Comply: Comply Care Advice Given Per Guideline SEE PHYSICIAN WITHIN 24 HOURS: * IF OFFICE WILL BE OPEN: You need to be seen within the next 24 hours. Call your doctor when the office opens, and make an appointment. TREATMENT: Continue the antibiotic and other treatment. CALL BACK IF: * You become worse. CARE ADVICE given per Strep Throat Infection on Antibiotic Follow-Up Call (Adult) guideline. Referrals [facility]

## 2016-05-07 NOTE — Telephone Encounter (Signed)
Seen at [facility]. zpack failed - changed to clindamycin.

## 2016-07-06 ENCOUNTER — Other Ambulatory Visit: Payer: Self-pay | Admitting: Family Medicine

## 2016-07-06 NOTE — Telephone Encounter (Signed)
Okay to refill?  Historical med.

## 2016-09-11 ENCOUNTER — Other Ambulatory Visit: Payer: Self-pay | Admitting: Family Medicine

## 2016-11-26 ENCOUNTER — Other Ambulatory Visit: Payer: Self-pay | Admitting: Family Medicine

## 2016-11-27 NOTE — Telephone Encounter (Signed)
Last Rx 08/2016. Pt has been seen for acute only since 06/2015/ pls advise

## 2017-02-08 ENCOUNTER — Encounter: Payer: Self-pay | Admitting: Internal Medicine

## 2017-02-08 ENCOUNTER — Ambulatory Visit (INDEPENDENT_AMBULATORY_CARE_PROVIDER_SITE_OTHER): Payer: BLUE CROSS/BLUE SHIELD | Admitting: Internal Medicine

## 2017-02-08 VITALS — BP 114/76 | HR 54 | Temp 97.9°F | Resp 16 | Wt 318.0 lb

## 2017-02-08 DIAGNOSIS — J029 Acute pharyngitis, unspecified: Secondary | ICD-10-CM

## 2017-02-08 DIAGNOSIS — J020 Streptococcal pharyngitis: Secondary | ICD-10-CM

## 2017-02-08 LAB — POCT RAPID STREP A (OFFICE): Rapid Strep A Screen: NEGATIVE

## 2017-02-08 MED ORDER — CLINDAMYCIN HCL 300 MG PO CAPS: 300.0000 mg | ORAL_CAPSULE | Freq: Three times a day (TID) | ORAL | 0 refills | Status: AC

## 2017-02-08 NOTE — Patient Instructions (Signed)
Take the clindamycin as prescribed.    Your prescription(s) have been submitted to your pharmacy or been printed and provided for you. Please take as directed and contact our office if you believe you are having problem(s) with the medication(s) or have any questions.  If your symptoms worsen or fail to improve, please contact our office for further instruction, or in case of emergency go directly to the emergency room at the closest medical facility.   General Recommendations:    Please drink plenty of fluids.  Get plenty of rest   Sleep in humidified air  Use saline nasal sprays  Netti pot  OTC Medications:  Decongestants - helps relieve congestion   Flonase (generic fluticasone) or Nasacort (generic triamcinolone) - please make sure to use the "cross-over" technique at a 45 degree angle towards the opposite eye as opposed to straight up the nasal passageway.   Sudafed (generic pseudoephedrine - Note this is the one that is available behind the pharmacy counter); Products with phenylephrine (-PE) may also be used but is often not as effective as pseudoephedrine.   If you have HIGH BLOOD PRESSURE - Coricidin HBP; AVOID any product that is -D as this contains pseudoephedrine which may increase your blood pressure.  Afrin (oxymetazoline) every 6-8 hours for up to 3 days.  Allergies - helps relieve runny nose, itchy eyes and sneezing   Claritin (generic loratidine), Allegra (fexofenidine), or Zyrtec (generic cyrterizine) for runny nose. These medications should not cause drowsiness.  Note - Benadryl (generic diphenhydramine) may be used however may cause drowsiness  Cough -   Delsym or Robitussin (generic dextromethorphan)  Expectorants - helps loosen mucus to ease removal   Mucinex (generic guaifenesin) as directed on the package.  Headaches / General Aches   Tylenol (generic acetaminophen) - DO NOT EXCEED 3 grams (3,000 mg) in a 24 hour time  period  Advil/Motrin (generic ibuprofen)  Sore Throat -   Salt water gargle   Chloraseptic (generic benzocaine) spray or lozenges / Sucrets (generic dyclonine)

## 2017-02-08 NOTE — Assessment & Plan Note (Signed)
Frequent strep - rapid strep here today is negative, but a good sample was not obtained so given his history we will go ahead and treat Had clindamycin last time and it worked well - sent to pharmacy Symptomatic treatment Call if no improvement

## 2017-02-08 NOTE — Progress Notes (Signed)
Subjective:    Patient ID: Victor Zimmerman, male    DOB: April 06, 1986, 31 y.o.   MRN: [id number]  HPI He is here for an acute visit for cold symptoms.  His symptoms started 2 weeks ago.   He is experiencing a sore throat.  It started about two weeks ago.  The has had strep many times and this feels similar.  He has some mild congestion, back aches,headaches and dizziness, but nothing out of the ordinary and related to the sore throat.   He denies fever, cough, sinus pain.    He has tried taking mucinex.     Medications and allergies reviewed with patient and updated if appropriate.  Patient Active Problem List   Diagnosis Date Noted  . GERD (gastroesophageal reflux disease) 06/24/2015  . Stressful life event affecting family 06/24/2015  . Costochondritis 03/31/2015  . Cold sensation of skin 01/26/2015  . Strep pharyngitis 01/21/2015  . Dizziness 08/12/2014  . PAC (premature atrial contraction) 06/10/2014  . Left knee pain 06/10/2014  . Seasonal allergic rhinitis   . Attention and concentration deficit 11/03/2013  . Obesity (BMI 35.0-39.9 without comorbidity)     Current Outpatient Prescriptions on File Prior to Visit  Medication Sig Dispense Refill  . acetaminophen (TYLENOL) 500 MG tablet Take 1,000 mg by mouth every 6 (six) hours as needed for pain.    Marland Kitchen aspirin-acetaminophen-caffeine (EXCEDRIN MIGRAINE) 250-250-65 MG tablet Take 2 tablets by mouth every 6 (six) hours as needed for headache.    Marland Kitchen azelastine (ASTELIN) 0.1 % nasal spray Place 2 sprays into both nostrils 2 (two) times daily. Use in each nostril as directed    . buPROPion (WELLBUTRIN XL) 300 MG 24 hr tablet TAKE 1 TABLET BY MOUTH DAILY 30 tablet 3  . fexofenadine (ALLEGRA) 180 MG tablet Take 180 mg by mouth daily as needed. Reported on 07/05/2015    . fluticasone (FLONASE) 50 MCG/ACT nasal spray USE ONE SPRAY IN EACH NOSTRIL DAILY 16 g 4  . hydrOXYzine (ATARAX/VISTARIL) 25 MG tablet Take 0.5-1 tablets (12.5-25 mg  total) by mouth 3 (three) times daily as needed for anxiety. 30 tablet 0  . ibuprofen (ADVIL,MOTRIN) 200 MG tablet Take 4 tablets (800 mg total) by mouth 3 (three) times daily as needed.    Ailene Ards 3-6-9 Fatty Acids (OMEGA DHA PO) Take 3 capsules by mouth daily.    Marland Kitchen omeprazole (PRILOSEC) 40 MG capsule TAKE ONE CAPSULE BY MOUTH DAILY 30 capsule 4  . phentermine 30 MG capsule Take 30 mg by mouth daily.     No current facility-administered medications on file prior to visit.     Past Medical History:  Diagnosis Date  . Morbid obesity (HCC)   . Seasonal allergic rhinitis    and dust  . Strep pharyngitis 01/21/2015    Past Surgical History:  Procedure Laterality Date  . NO PAST SURGERIES      Social History   Social History  . Marital status: Married    Spouse name: N/A  . Number of children: N/A  . Years of education: N/A   Social History Main Topics  . Smoking status: Never Smoker  . Smokeless tobacco: Never Used  . Alcohol use No  . Drug use: No  . Sexual activity: Yes    Birth control/ protection: Condom   Other Topics Concern  . None   Social History Narrative   Lives with wife, 5 children   Occupation: Arts administrator, starting own business  Edu: HS   Activity: no regular exercise   Diet: some water, vegetables daily    Family History  Problem Relation Age of Onset  . Diabetes Other        maternal side  . Hypertension Mother   . Cancer Maternal Uncle        brain  . Cancer Maternal Uncle        unknown  . Cancer Maternal Aunt        unsure  . CAD Neg Hx     Review of Systems  Constitutional: Negative for chills and fever.  HENT: Positive for congestion (very mild) and sore throat. Negative for ear pain, postnasal drip, sinus pain and sinus pressure.   Respiratory: Negative for cough, shortness of breath and wheezing.   Gastrointestinal: Negative for abdominal pain, diarrhea and nausea.  Musculoskeletal: Positive for myalgias (back).  Neurological:  Positive for dizziness (when working) and headaches (occ, not new). Negative for light-headedness.       Objective:   Vitals:   02/08/17 1448  BP: 114/76  Pulse: (!) 54  Resp: 16  Temp: 97.9 F (36.6 C)  SpO2: 97%   Filed Weights   02/08/17 1448  Weight: (!) 318 lb (144.2 kg)   Body mass index is 41.67 kg/m.  Wt Readings from Last 3 Encounters:  02/08/17 (!) 318 lb (144.2 kg)  05/05/16 (!) 318 lb 1.3 oz (144.3 kg)  05/03/16 (!) 311 lb 12 oz (141.4 kg)     Physical Exam GENERAL APPEARANCE: Appears stated age, well appearing, NAD EYES: conjunctiva clear, no icterus HEENT: bilateral tympanic membranes and ear canals normal, oropharynx with mild erythema w/o exudate, no thyromegaly, trachea midline, no cervical or supraclavicular lymphadenopathy LUNGS: Clear to auscultation without wheeze or crackles, unlabored breathing, good air entry bilaterally HEART: Normal S1,S2 without murmurs EXTREMITIES: Without clubbing, cyanosis, or edema       Assessment & Plan:   See Problem List for Assessment and Plan of chronic medical problems.

## 2017-03-25 ENCOUNTER — Encounter: Payer: Self-pay | Admitting: Family Medicine

## 2017-03-25 ENCOUNTER — Ambulatory Visit: Payer: BLUE CROSS/BLUE SHIELD | Admitting: Family Medicine

## 2017-03-25 VITALS — BP 118/70 | HR 59 | Temp 98.2°F | Wt 313.0 lb

## 2017-03-25 DIAGNOSIS — R41840 Attention and concentration deficit: Secondary | ICD-10-CM | POA: Diagnosis not present

## 2017-03-25 MED ORDER — ATOMOXETINE HCL 40 MG PO CAPS: 40.0000 mg | ORAL_CAPSULE | Freq: Every day | ORAL | 3 refills | Status: DC

## 2017-03-25 NOTE — Addendum Note (Signed)
Addended by: Eustaquio BoydenGUTIERREZ, Linzie Boursiquot on: 03/25/2017 10:18 AM   Modules accepted: Level of Service

## 2017-03-25 NOTE — Assessment & Plan Note (Signed)
Screening questions positive for ADD, not hyperactivity component.  Feels wellbutrin 300mg  XL was ineffective so he stopped. Prior on this since 2015.  Recommend trial strattera - sent to pharmacy to price out.  Pt interested in psychology referral for formal adult ADD testing and then discussion on stimulant Rx if positive. Will refer.

## 2017-03-25 NOTE — Patient Instructions (Signed)
For non stimulant route - trial strattera (atomoxetine) sent to pharmacy. We will refer you to psychology for formal ADD testing. See Shirlee LimerickMarion on your way out today.

## 2017-03-25 NOTE — Progress Notes (Signed)
BP 118/70 (BP Location: Left Arm, Patient Position: Sitting, Cuff Size: Large)   Pulse (!) 59   Temp 98.2 F (36.8 C) (Oral)   Wt (!) 313 lb (142 kg)   SpO2 97%   BMI 41.01 kg/m    CC: discuss meds Subjective:    Patient ID: Victor Zimmerman, male    DOB: 10/14/1985, 31 y.o.   MRN: [id number]  HPI: Victor Zimmerman is a 31 y.o. male presenting on 03/25/2017 for Medication consult (Wants to change buproprion. Not as effective)   Attention and concentration deficit - strattera tried 2015 but unclear results (or if affordable). He takes wellbutrin 300mg  XR daily for presumed ADD. Has not had psychological testing. Positive adult ADD screen 10/2013.   Stopped wellbutrin 1 month ago - didn't feel it was effective anymore.  Main trouble remains task completion, feels scatter brained, trouble maintaining attention especially when he has several tasks to complete. Noticing increasing anxiety as well.   Pt endorses persistent pattern of inattention that interferes with daily functioning. Symptoms present in 2 or more settings. Symptoms present before 32 years of age? yes Inattention (6+, 6 months): Careless mistakes? no Difficulty sustaining attention? yes Doesn't listen when spoken to directly? yes Lacks follow through with instructions or difficulty completing tasks? yes Difficulty organizing tasks/activities? yes Avoiding tasks that require sustained attention? yes Easily losing things needed for tasks? yes Easily distracted by external stimuli? yes Forgetful in daily activities? yes Hyperactive/impulsive (6+, 6 months): Fidgeting, tapping hands/feet? yes Leaves seat when expected to remain in seat? no Feeling restless, or running around when not expected? yes Unable to engage in leisurely activities quietly? no Always on the go, "driven by a motor"? no Excessive talking? no Blurting out answer, responds to other's questions? no Difficulty waiting in line or waiting turn?  yes Interrupting or intruding on others? no  Relevant past medical, surgical, family and social history reviewed and updated as indicated. Interim medical history since our last visit reviewed. Allergies and medications reviewed and updated. Outpatient Medications Prior to Visit  Medication Sig Dispense Refill  . acetaminophen (TYLENOL) 500 MG tablet Take 1,000 mg by mouth every 6 (six) hours as needed for pain.    Marland Kitchen. aspirin-acetaminophen-caffeine (EXCEDRIN MIGRAINE) 250-250-65 MG tablet Take 2 tablets by mouth every 6 (six) hours as needed for headache.    Marland Kitchen. azelastine (ASTELIN) 0.1 % nasal spray Place 2 sprays into both nostrils 2 (two) times daily. Use in each nostril as directed    . buPROPion (WELLBUTRIN XL) 300 MG 24 hr tablet TAKE 1 TABLET BY MOUTH DAILY 30 tablet 3  . fexofenadine (ALLEGRA) 180 MG tablet Take 180 mg by mouth daily as needed. Reported on 07/05/2015    . fluticasone (FLONASE) 50 MCG/ACT nasal spray USE ONE SPRAY IN EACH NOSTRIL DAILY 16 g 4  . hydrOXYzine (ATARAX/VISTARIL) 25 MG tablet Take 0.5-1 tablets (12.5-25 mg total) by mouth 3 (three) times daily as needed for anxiety. 30 tablet 0  . ibuprofen (ADVIL,MOTRIN) 200 MG tablet Take 4 tablets (800 mg total) by mouth 3 (three) times daily as needed.    Ailene Ards. Omega 3-6-9 Fatty Acids (OMEGA DHA PO) Take 3 capsules by mouth daily.    Marland Kitchen. omeprazole (PRILOSEC) 40 MG capsule TAKE ONE CAPSULE BY MOUTH DAILY 30 capsule 4  . phentermine 30 MG capsule Take 30 mg by mouth daily.     No facility-administered medications prior to visit.      Per HPI unless specifically indicated in  ROS section below Review of Systems     Objective:    BP 118/70 (BP Location: Left Arm, Patient Position: Sitting, Cuff Size: Large)   Pulse (!) 59   Temp 98.2 F (36.8 C) (Oral)   Wt (!) 313 lb (142 kg)   SpO2 97%   BMI 41.01 kg/m   Wt Readings from Last 3 Encounters:  03/25/17 (!) 313 lb (142 kg)  02/08/17 (!) 318 lb (144.2 kg)  05/05/16 (!)  318 lb 1.3 oz (144.3 kg)    Physical Exam  Constitutional: He appears well-developed and well-nourished. No distress.  Nursing note and vitals reviewed.  No results found for: TSH     Assessment & Plan:   Problem List Items Addressed This Visit    Attention and concentration deficit - Primary    Screening questions positive for ADD, not hyperactivity component.  Feels wellbutrin 300mg  XL was ineffective so he stopped. Prior on this since 2015.  Recommend trial strattera - sent to pharmacy to price out.  Pt interested in psychology referral for formal adult ADD testing and then discussion on stimulant Rx if positive. Will refer.       Relevant Orders   Ambulatory referral to Psychology       Follow up plan: Return if symptoms worsen or fail to improve.  Eustaquio BoydenJavier Tarynn Garling, MD

## 2017-05-06 ENCOUNTER — Telehealth: Payer: Self-pay | Admitting: Family Medicine

## 2017-05-06 MED ORDER — OSELTAMIVIR PHOSPHATE 75 MG PO CAPS: 75.0000 mg | ORAL_CAPSULE | Freq: Two times a day (BID) | ORAL | 0 refills | Status: DC

## 2017-05-06 NOTE — Telephone Encounter (Signed)
plz document sxs he is having.  plz notify [facility]. Come in if not improving or worsening symptoms.

## 2017-05-06 NOTE — Telephone Encounter (Signed)
Left message on vm for pt to call back.  Need to inform pt [facility] rx was sent to [facility].  Also, Dr. Sharen HonesGutierrez is wanting pt's sxs documented and to let pt know to schedule OV if not improving after med or sxs worsen.

## 2017-05-06 NOTE — Telephone Encounter (Signed)
Copied from CRM [id number]. Topic: Inquiry >> May 06, 2017 10:00 AM Alexander BergeronBarksdale, Victor Zimmerman: Reason for CRM: pt called to see if its necessary to come in for a visit; pt states the he has all the symptoms for the flu b/c child has it, wants to know if an appt is needed or if a Rx for the flu could be called in

## 2017-05-07 NOTE — Telephone Encounter (Signed)
Noted. Fwd to Dr. G. 

## 2017-05-07 NOTE — Telephone Encounter (Signed)
Pt's sx were fever x 4 days. Headache,cough,chills, Son was dx with flu at the hospital. Pt was informed by agent that the Tamiflu was called in to the pharmacy

## 2017-05-30 ENCOUNTER — Ambulatory Visit: Payer: BLUE CROSS/BLUE SHIELD | Admitting: Psychology

## 2017-06-04 ENCOUNTER — Ambulatory Visit: Payer: Self-pay | Admitting: Psychology

## 2017-07-02 ENCOUNTER — Ambulatory Visit: Payer: BLUE CROSS/BLUE SHIELD | Admitting: Psychology

## 2017-07-09 ENCOUNTER — Ambulatory Visit: Payer: BLUE CROSS/BLUE SHIELD | Admitting: Psychology

## 2017-07-26 ENCOUNTER — Other Ambulatory Visit: Payer: Self-pay | Admitting: Family Medicine

## 2017-08-07 ENCOUNTER — Ambulatory Visit (INDEPENDENT_AMBULATORY_CARE_PROVIDER_SITE_OTHER): Payer: BLUE CROSS/BLUE SHIELD | Admitting: Psychology

## 2017-08-07 DIAGNOSIS — F4322 Adjustment disorder with anxiety: Secondary | ICD-10-CM | POA: Diagnosis not present

## 2017-08-07 DIAGNOSIS — F900 Attention-deficit hyperactivity disorder, predominantly inattentive type: Secondary | ICD-10-CM

## 2017-08-15 ENCOUNTER — Encounter: Payer: Self-pay | Admitting: Family Medicine

## 2017-08-15 ENCOUNTER — Ambulatory Visit: Payer: BLUE CROSS/BLUE SHIELD | Admitting: Family Medicine

## 2017-08-15 VITALS — BP 116/68 | HR 67 | Temp 98.6°F | Ht 73.0 in | Wt 312.0 lb

## 2017-08-15 DIAGNOSIS — F900 Attention-deficit hyperactivity disorder, predominantly inattentive type: Secondary | ICD-10-CM

## 2017-08-15 MED ORDER — AMPHETAMINE-DEXTROAMPHET ER 20 MG PO CP24: 20.0000 mg | ORAL_CAPSULE | ORAL | 0 refills | Status: DC

## 2017-08-15 NOTE — Patient Instructions (Addendum)
Work on Interior and spatial designervisual tracking system - calendar or chart that you write down appointments, deadlines, etc.  Start adderall XR sent to pharmacy. Coupon printed out as well.  Controlled substance agreement today. UDS today.  Return in 1-2 months for follow up visit.

## 2017-08-15 NOTE — Progress Notes (Signed)
BP 116/68 (BP Location: Left Arm, Patient Position: Sitting, Cuff Size: Large)   Pulse 67   Temp 98.6 F (37 C) (Oral)   Ht 6\' 1"  (1.854 m)   Wt (!) 312 lb (141.5 kg)   SpO2 95%   BMI 41.16 kg/m    CC: ADHD Subjective:    Patient ID: Quillian QuinceMichael Holm, male    DOB: 10/01/1985, 32 y.o.   MRN: [id number]  HPI: Quillian QuinceMichael Mallet is a 32 y.o. male presenting on 08/15/2017 for ADHD (States he was tested and dx with ADHD and is here to start medication.)   Received community message from Dr Reggy EyeAltabet 07/2017: Patient was cooperative and displayed good effort. The results appear representative of current functioning. Results indicated significant deficits in attention, working memory, planning, organization, and task monitoring. patient meets the criterion for ADHD - Primarily Inattentive Presentation. Depression, anxiety and stress were rated to be within normal limits. Recommendations include discussing medication options with PCP, developing an visual organization system for tracking work responsibilities,and delegating administrative/clerical tasks as appropriate. Results discussed with patient. Report to follow.   Positive testing for ADHD.  Failed strattera 2015. Wellbutrin XR lost effect. Did not retry strattera.  Interested in stimulant trial.   Relevant past medical, surgical, family and social history reviewed and updated as indicated. Interim medical history since our last visit reviewed. Allergies and medications reviewed and updated. Outpatient Medications Prior to Visit  Medication Sig Dispense Refill  . acetaminophen (TYLENOL) 500 MG tablet Take 1,000 mg by mouth every 6 (six) hours as needed for pain.    Marland Kitchen. aspirin-acetaminophen-caffeine (EXCEDRIN MIGRAINE) 250-250-65 MG tablet Take 2 tablets by mouth every 6 (six) hours as needed for headache.    . Azelastine HCl 137 MCG/SPRAY SOLN USE ONE (1) SPRAY IN EACH NOSTRIL TWO TIMES A DAY 30 mL 3  . fexofenadine (ALLEGRA) 180 MG  tablet Take 180 mg by mouth daily as needed. Reported on 07/05/2015    . fluticasone (FLONASE) 50 MCG/ACT nasal spray USE ONE SPRAY IN EACH NOSTRIL DAILY 16 g 4  . hydrOXYzine (ATARAX/VISTARIL) 25 MG tablet Take 0.5-1 tablets (12.5-25 mg total) by mouth 3 (three) times daily as needed for anxiety. 30 tablet 0  . ibuprofen (ADVIL,MOTRIN) 200 MG tablet Take 4 tablets (800 mg total) by mouth 3 (three) times daily as needed.    Marland Kitchen. omeprazole (PRILOSEC) 40 MG capsule TAKE ONE CAPSULE BY MOUTH DAILY 30 capsule 4  . atomoxetine (STRATTERA) 40 MG capsule Take 1 capsule (40 mg total) by mouth daily. 30 capsule 3  . buPROPion (WELLBUTRIN XL) 300 MG 24 hr tablet TAKE 1 TABLET BY MOUTH DAILY 30 tablet 3  . Omega 3-6-9 Fatty Acids (OMEGA DHA PO) Take 3 capsules by mouth daily.    Marland Kitchen. oseltamivir (TAMIFLU) 75 MG capsule Take 1 capsule (75 mg total) by mouth 2 (two) times daily. 10 capsule 0  . phentermine 30 MG capsule Take 30 mg by mouth daily.     No facility-administered medications prior to visit.      Per HPI unless specifically indicated in ROS section below Review of Systems     Objective:    BP 116/68 (BP Location: Left Arm, Patient Position: Sitting, Cuff Size: Large)   Pulse 67   Temp 98.6 F (37 C) (Oral)   Ht 6\' 1"  (1.854 m)   Wt (!) 312 lb (141.5 kg)   SpO2 95%   BMI 41.16 kg/m   Wt Readings from Last 3 Encounters:  08/15/17 (!) 312 lb (141.5 kg)  03/25/17 (!) 313 lb (142 kg)  02/08/17 (!) 318 lb (144.2 kg)    Physical Exam  Constitutional: He appears well-developed and well-nourished. No distress.  HENT:  Mouth/Throat: Oropharynx is clear and moist. No oropharyngeal exudate.  Cardiovascular: Normal rate, regular rhythm and normal heart sounds.  No murmur heard. Pulmonary/Chest: Effort normal and breath sounds normal. No respiratory distress. He has no wheezes. He has no rales.  Musculoskeletal: He exhibits no edema.  Psychiatric: He has a normal mood and affect.  Nursing note  and vitals reviewed.     Assessment & Plan:   Problem List Items Addressed This Visit    ADHD (attention deficit hyperactivity disorder), inattentive type - Primary    Completed psychological testing positive for ADHD inattentive type. Did not respond to wellbutrin. Did not try straterra. Interested in stimulant treatment. discussed options. Will Rx adderall XR 20mg  daily. Discussed common side effects. Discussed controlled substance and implications as well as expectations to receive at our office. UDS and agreement completed today. RTC 6 wks f/u visit.  Reviewed visual tracking system to keep track of work deadlines.           Meds ordered this encounter  Medications  . amphetamine-dextroamphetamine (ADDERALL XR) 20 MG 24 hr capsule    Sig: Take 1 capsule (20 mg total) by mouth every morning.    Dispense:  30 capsule    Refill:  0   No orders of the defined types were placed in this encounter.   Follow up plan: Return in about 2 months (around 10/15/2017) for follow up visit.  [name], MD

## 2017-08-15 NOTE — Assessment & Plan Note (Addendum)
Completed psychological testing positive for ADHD inattentive type. Did not respond to wellbutrin. Did not try straterra. Interested in stimulant treatment. discussed options. Will Rx adderall XR 20mg  daily. Discussed common side effects. Discussed controlled substance and implications as well as expectations to receive at our office. UDS and agreement completed today. RTC 6 wks f/u visit.  Reviewed visual tracking system to keep track of work deadlines.

## 2017-08-16 LAB — PAIN MGMT, PROFILE 8 W/CONF, U
6 Acetylmorphine: NEGATIVE ng/mL (ref <10)
ALCOHOL METABOLITES: NEGATIVE ng/mL (ref <500)
Amphetamines: NEGATIVE ng/mL (ref <500)
Benzodiazepines: NEGATIVE ng/mL (ref <100)
Buprenorphine, Urine: NEGATIVE ng/mL (ref <5)
COCAINE METABOLITE: NEGATIVE ng/mL (ref <150)
Creatinine: 336.3 mg/dL
MDMA: NEGATIVE ng/mL (ref <500)
Marijuana Metabolite: NEGATIVE ng/mL (ref <20)
OXIDANT: NEGATIVE ug/mL (ref <200)
OXYCODONE: NEGATIVE ng/mL (ref <100)
Opiates: NEGATIVE ng/mL (ref <100)
pH: 6.89 (ref 4.5–9.0)

## 2017-08-28 ENCOUNTER — Telehealth: Payer: Self-pay | Admitting: Family Medicine

## 2017-08-28 NOTE — Telephone Encounter (Signed)
Copied from CRM [id number]. Topic: Quick Communication - See Telephone Encounter >> Aug 28, 2017  8:39 AM Jolayne Haines L wrote: CRM for notification. See Telephone encounter for: 08/28/17.  Patient said that he currently takes amphetamine-dextroamphetamine (ADDERALL XR) 20 MG 24 hr capsule  and he is wanting to know can it be increased up a little bit? Please advise.  Call back is [phone]

## 2017-08-28 NOTE — Telephone Encounter (Signed)
Pt last f/u 04/25 for ADHD. pls advise

## 2017-08-28 NOTE — Telephone Encounter (Signed)
Patient advised.

## 2017-08-28 NOTE — Telephone Encounter (Signed)
That needs to be addressed by PCP when he is back in town.  I would not recommend changing his medications for now.  Routed to PCP for input when he gets back in town next week.

## 2017-09-03 ENCOUNTER — Telehealth: Payer: Self-pay | Admitting: Family Medicine

## 2017-09-03 MED ORDER — AMPHETAMINE-DEXTROAMPHET ER 30 MG PO CP24: 30.0000 mg | ORAL_CAPSULE | ORAL | 0 refills | Status: DC

## 2017-09-03 NOTE — Telephone Encounter (Signed)
plz notify- adderall XR doesn't come in  - I'd like him to start with  XR - sent to pharmacy. Update [place] with effect.

## 2017-09-03 NOTE — Telephone Encounter (Signed)
Copied from CRM [id number]. Topic: Quick Communication - See Telephone Encounter >> Sep 03, 2017  4:35 PM Arlyss Gandy, NT wrote: CRM for notification. See Telephone encounter for: 09/03/17. Pt would like to see if his amphetamine-dextroamphetamine (ADDERALL XR) 20 MG 24 hr capsule can be increased to ? The  is not really helping since after the 1st day. He took 2 capsules today and it has helped him so much.

## 2017-09-04 NOTE — Telephone Encounter (Signed)
Spoke with pt relaying instructions and message per Dr. G.  Pt verbalizes understanding. 

## 2017-09-26 ENCOUNTER — Ambulatory Visit: Payer: BLUE CROSS/BLUE SHIELD | Admitting: Family Medicine

## 2017-09-26 ENCOUNTER — Encounter: Payer: Self-pay | Admitting: Family Medicine

## 2017-09-26 VITALS — BP 118/70 | HR 53 | Temp 98.7°F | Ht 73.0 in | Wt 304.8 lb

## 2017-09-26 DIAGNOSIS — F900 Attention-deficit hyperactivity disorder, predominantly inattentive type: Secondary | ICD-10-CM | POA: Diagnosis not present

## 2017-09-26 DIAGNOSIS — M25562 Pain in left knee: Secondary | ICD-10-CM

## 2017-09-26 MED ORDER — METHYLPHENIDATE HCL ER (CD) 30 MG PO CPCR: 30.0000 mg | ORAL_CAPSULE | ORAL | 0 refills | Status: DC

## 2017-09-26 NOTE — Progress Notes (Signed)
BP 118/70 (BP Location: Left Arm, Patient Position: Sitting, Cuff Size: Large)   Pulse (!) 53   Temp 98.7 F (37.1 C) (Oral)   Ht 6\' 1"  (1.854 m)   Wt (!) 304 lb 12 oz (138.2 kg)   SpO2 97%   BMI 40.21 kg/m    CC: 6 wk f/u ADHD Subjective:    Patient ID: Victor Zimmerman, male    DOB: [birth year], 32 y.o.   MRN: [id number]  HPI: Victor Zimmerman is a 32 y.o. male presenting on 09/26/2017 for ADHD (Here for 6 wk follow up.)   See prior note for details.  Recent dx ADHD primarily inattention.   Adderall XR 20mg  was not effective - 30mg  dose was started 09/04/2017. This has significantly helped work efficiency, production.   Denies insomnia, headache, chest pain. Appetite ok.   Noticing intermittent joint and muscle pain since he's increased dose (R knee, elbow, both shoulders) - has treated this with ice and ibuprofen - this could last up to 3 days. Has increased ibuprofen use.   Weight down - started working on [employer].   Ongoing L knee pain thinks jumper's knee - using knee band which helps. Chronic issue for last 15 yrs. Describes anterior knee pain worse with walking/running. Worse going up stairs.   Relevant past medical, surgical, family and social history reviewed and updated as indicated. Interim medical history since our last visit reviewed. Allergies and medications reviewed and updated. Outpatient Medications Prior to Visit  Medication Sig Dispense Refill  . acetaminophen (TYLENOL) 500 MG tablet Take 1,000 mg by mouth every 6 (six) hours as needed for pain.    Marland Kitchen aspirin-acetaminophen-caffeine (EXCEDRIN MIGRAINE) 250-250-65 MG tablet Take 2 tablets by mouth every 6 (six) hours as needed for headache.    . Azelastine HCl 137 MCG/SPRAY SOLN USE ONE (1) SPRAY IN EACH NOSTRIL TWO TIMES A DAY 30 mL 3  . fexofenadine (ALLEGRA) 180 MG tablet Take 180 mg by mouth daily as needed. Reported on 07/05/2015    . fluticasone (FLONASE) 50 MCG/ACT nasal spray USE ONE SPRAY IN EACH  NOSTRIL DAILY 16 g 4  . hydrOXYzine (ATARAX/VISTARIL) 25 MG tablet Take 0.5-1 tablets (12.5-25 mg total) by mouth 3 (three) times daily as needed for anxiety. 30 tablet 0  . ibuprofen (ADVIL,MOTRIN) 200 MG tablet Take 4 tablets (800 mg total) by mouth 3 (three) times daily as needed.    Marland Kitchen omeprazole (PRILOSEC) 40 MG capsule TAKE ONE CAPSULE BY MOUTH DAILY 30 capsule 4  . amphetamine-dextroamphetamine (ADDERALL XR) 30 MG 24 hr capsule Take 1 capsule (30 mg total) by mouth every morning. 30 capsule 0   No facility-administered medications prior to visit.      Per HPI unless specifically indicated in ROS section below Review of Systems     Objective:    BP 118/70 (BP Location: Left Arm, Patient Position: Sitting, Cuff Size: Large)   Pulse (!) 53   Temp 98.7 F (37.1 C) (Oral)   Ht 6\' 1"  (1.854 m)   Wt (!) 304 lb 12 oz (138.2 kg)   SpO2 97%   BMI 40.21 kg/m   Wt Readings from Last 3 Encounters:  09/26/17 (!) 304 lb 12 oz (138.2 kg)  08/15/17 (!) 312 lb (141.5 kg)  03/25/17 (!) 313 lb (142 kg)    Physical Exam  Constitutional: He appears well-developed and well-nourished. No distress.  Musculoskeletal: Normal range of motion. He exhibits no edema.  R knee WNL L Knee exam:  No deformity on inspection. Some discomfort to palpation at pes anserine bursa No effusion/swelling noted. FROM in flex/extension without crepitus. No popliteal fullness. Neg drawer test. Neg mcmurray test. No pain with valgus/varus stress. No PFgrind. No abnormal patellar mobility.   Skin: Skin is warm and dry. No erythema.  Nursing note and vitals reviewed.  Results for orders placed or performed in visit on 08/15/17  Pain Mgmt, Profile 8 w/Conf, U  Result Value Ref Range   Creatinine 336.3 > or = 20. mg/dL   pH 4.096.89 4.5 - 9.0   Oxidant NEGATIVE <200 mcg/mL   Amphetamines NEGATIVE <500 ng/mL   medMATCH Amphetamines CONSISTENT    Benzodiazepines NEGATIVE <100 ng/mL   medMATCH Benzodiazepines  CONSISTENT    Marijuana Metabolite NEGATIVE <20 ng/mL   medMATCH Marijuana Metab CONSISTENT    Cocaine Metabolite NEGATIVE <150 ng/mL   medMATCH Cocaine Metab CONSISTENT    Opiates NEGATIVE <100 ng/mL   medMATCH Opiates CONSISTENT    Oxycodone NEGATIVE <100 ng/mL   medMATCH Oxycodone CONSISTENT    Buprenorphine, Urine NEGATIVE <5 ng/mL   medMATCH Buprenorphine CONSISTENT    MDMA NEGATIVE <500 ng/mL   [facility]    Alcohol Metabolites NEGATIVE <500 ng/mL   medMATCH Alcohol Metab CONSISTENT    6 Acetylmorphine NEGATIVE <10 ng/mL   medMATCH 6 Acetylmorphine CONSISTENT       Assessment & Plan:   Problem List Items Addressed This Visit    ADHD (attention deficit hyperactivity disorder), inattentive type - Primary    adderall 30mg  XR was effective however may be causing side effect of myalgia, arthralgia. Will stop this, trial metadate CD 30mg  daily. RTC 1 mo f/u visit.       Left anterior knee pain    Overall benign exam. Not consistent with jumper's knee. ?PFPS vs pes anserine bursitis. Discussed NSAID, ice/heating pad, provided with exercises [facility]. Update with effect.           Meds ordered this encounter  Medications  . methylphenidate (METADATE CD) 30 MG CR capsule    Sig: Take 1 capsule (30 mg total) by mouth every morning.    Dispense:  30 capsule    Refill:  0   No orders of the defined types were placed in this encounter.   Follow up plan: Return in about 1 month (around 10/24/2017) for follow up visit.  Eustaquio BoydenJavier Dan Dissinger, MD

## 2017-09-26 NOTE — Assessment & Plan Note (Signed)
adderall 30mg  XR was effective however may be causing side effect of myalgia, arthralgia. Will stop this, trial metadate CD 30mg  daily. RTC 1 mo f/u visit.

## 2017-09-26 NOTE — Assessment & Plan Note (Signed)
Overall benign exam. Not consistent with jumper's knee. ?PFPS vs pes anserine bursitis. Discussed NSAID, ice/heating pad, provided with exercises for [facility]. Update with effect.

## 2017-09-26 NOTE — Patient Instructions (Addendum)
Stop adderall. Try metadate CD long acting medication 30 mg daily.  Let me know effect on joint and muscle aches.  Return in 4-6 weeks for follow up visit.  For knee - try exercises provided today.

## 2017-10-01 ENCOUNTER — Telehealth: Payer: Self-pay | Admitting: Family Medicine

## 2017-10-01 NOTE — Telephone Encounter (Signed)
Copied from CRM [phone] [id number]. Topic: Quick Communication - See Telephone Encounter >> Oct 01, 2017  2:31 PM Waymon AmatoBurton, Donna F wrote: Pt is needing a refill on flonase and prilosec   [facility] [address] [phone]  [phone] Best number

## 2017-10-02 MED ORDER — FLUTICASONE PROPIONATE 50 MCG/ACT NA SUSP: NASAL | 4 refills | Status: DC

## 2017-10-02 MED ORDER — OMEPRAZOLE 40 MG PO CPDR: 40.0000 mg | DELAYED_RELEASE_CAPSULE | Freq: Every day | ORAL | 4 refills | Status: DC

## 2017-10-31 ENCOUNTER — Other Ambulatory Visit: Payer: Self-pay | Admitting: Family Medicine

## 2017-10-31 NOTE — Telephone Encounter (Signed)
Copied from CRM [id number]. Topic: Quick Communication - Rx Refill/Question >> Oct 31, 2017 11:42 AM Percival SpanishKennedy, Cheryl W wrote: Medication methylphenidate (METADATE CD) 30 MG CR capsule  Has the patient contacted their pharmacy yes    Preferred Pharmacy  [facility] [address]   Agent: Please be advised that RX refills may take up to 3 business days. We ask that you follow-up with your pharmacy.

## 2017-10-31 NOTE — Telephone Encounter (Signed)
Name of Medication: Methylphenidate 30 mg CR [facility] Date and Quantity: #30 on 09/26/17 Last Office Visit and Type: 09/26/17 Next Office Visit and Type: none scheduled Last Controlled Substance Agreement Date: none Last UDS:08/15/17

## 2017-11-01 MED ORDER — METHYLPHENIDATE HCL ER (CD) 30 MG PO CPCR: 30.0000 mg | ORAL_CAPSULE | ORAL | 0 refills | Status: DC

## 2017-11-01 NOTE — Telephone Encounter (Signed)
Sent.  Routed to PCP as FYI.   °

## 2017-11-04 ENCOUNTER — Ambulatory Visit (INDEPENDENT_AMBULATORY_CARE_PROVIDER_SITE_OTHER)
Admission: RE | Admit: 2017-11-04 | Discharge: 2017-11-04 | Disposition: A | Discharge status: Home / Self Care | Payer: BLUE CROSS/BLUE SHIELD | Source: Ambulatory Visit | Attending: Family Medicine | Admitting: Family Medicine

## 2017-11-04 ENCOUNTER — Ambulatory Visit: Payer: BLUE CROSS/BLUE SHIELD | Admitting: Family Medicine

## 2017-11-04 ENCOUNTER — Encounter: Payer: Self-pay | Admitting: Family Medicine

## 2017-11-04 VITALS — BP 118/80 | HR 56 | Temp 98.1°F | Ht 73.0 in | Wt 300.0 lb

## 2017-11-04 DIAGNOSIS — M25562 Pain in left knee: Secondary | ICD-10-CM

## 2017-11-04 DIAGNOSIS — I491 Atrial premature depolarization: Secondary | ICD-10-CM

## 2017-11-04 DIAGNOSIS — F900 Attention-deficit hyperactivity disorder, predominantly inattentive type: Secondary | ICD-10-CM | POA: Diagnosis not present

## 2017-11-04 NOTE — Patient Instructions (Signed)
You are doing well on metadate CD - continue.  xrays of left knee today Watch skipped heart beats.  Return as needed or in 6 months for physical.

## 2017-11-04 NOTE — Assessment & Plan Note (Signed)
Will get baseline xray today given longevity of symptoms. Benign exam today. Anticipate PFPS.

## 2017-11-04 NOTE — Progress Notes (Signed)
BP 118/80 (BP Location: Left Arm, Patient Position: Sitting, Cuff Size: Large)   Pulse (!) 56   Temp 98.1 F (36.7 C) (Oral)   Ht 6\' 1"  (1.854 m)   Wt 300 lb (136.1 kg)   SpO2 96%   BMI 39.58 kg/m    CC: 1 mo f/u visit ADHD Subjective:    Patient ID: Victor Zimmerman, male    DOB: 08/19/1985, 32 y.o.   MRN: [id number]  HPI: Victor Zimmerman is a 32 y.o. male presenting on 11/04/2017 for ADHD (Here for 1 mo f/u.)   Recent dx ADHD inattention predominant.   On adderall XR 30mg  daily, effective for managing inattention however was causing myalgia, arthralgias. Last visit we started metadate CD 30mg  daily. Also takes on weekends. Very happy with results.   No insomnia, headache, chest pain, anorexia. Denies palpitations or skipped beats.  H/o skipped beats when on phentermine.   Still with some R lateral knee pain - but better since change off adderall.  Ongoing R chronic knee pain (ache described as lateral tightening) for years - ?bursitis vs PFPS. Worse going up stairs. Started after playing basketball age 32yo, doesn't recall injury.   Relevant past medical, surgical, family and social history reviewed and updated as indicated. Interim medical history since our last visit reviewed. Allergies and medications reviewed and updated. Outpatient Medications Prior to Visit  Medication Sig Dispense Refill  . acetaminophen (TYLENOL) 500 MG tablet Take 1,000 mg by mouth every 6 (six) hours as needed for pain.    Marland Kitchen. aspirin-acetaminophen-caffeine (EXCEDRIN MIGRAINE) 250-250-65 MG tablet Take 2 tablets by mouth every 6 (six) hours as needed for headache.    . Azelastine HCl 137 MCG/SPRAY SOLN USE ONE (1) SPRAY IN EACH NOSTRIL TWO TIMES A DAY 30 mL 3  . fexofenadine (ALLEGRA) 180 MG tablet Take 180 mg by mouth daily as needed. Reported on 07/05/2015    . fluticasone (FLONASE) 50 MCG/ACT nasal spray USE ONE SPRAY IN EACH NOSTRIL DAILY 16 g 4  . hydrOXYzine (ATARAX/VISTARIL) 25 MG tablet Take 0.5-1  tablets (12.5-25 mg total) by mouth 3 (three) times daily as needed for anxiety. 30 tablet 0  . ibuprofen (ADVIL,MOTRIN) 200 MG tablet Take 4 tablets (800 mg total) by mouth 3 (three) times daily as needed.    . methylphenidate (METADATE CD) 30 MG CR capsule Take 1 capsule (30 mg total) by mouth every morning. 30 capsule 0  . omeprazole (PRILOSEC) 40 MG capsule Take 1 capsule (40 mg total) by mouth daily. 30 capsule 4   No facility-administered medications prior to visit.      Per HPI unless specifically indicated in ROS section below Review of Systems     Objective:    BP 118/80 (BP Location: Left Arm, Patient Position: Sitting, Cuff Size: Large)   Pulse (!) 56   Temp 98.1 F (36.7 C) (Oral)   Ht 6\' 1"  (1.854 m)   Wt 300 lb (136.1 kg)   SpO2 96%   BMI 39.58 kg/m   Wt Readings from Last 3 Encounters:  11/04/17 300 lb (136.1 kg)  09/26/17 (!) 304 lb 12 oz (138.2 kg)  08/15/17 (!) 312 lb (141.5 kg)    Physical Exam  Constitutional: He appears well-developed and well-nourished. No distress.  HENT:  Head: Normocephalic and atraumatic.  Mouth/Throat: Oropharynx is clear and moist. No oropharyngeal exudate.  Cardiovascular: Normal rate, regular rhythm and normal heart sounds.  Occasional extrasystoles are present.  No murmur heard. Pulmonary/Chest: Effort normal  and breath sounds normal. No respiratory distress. He has no wheezes. He has no rales.  Musculoskeletal: He exhibits no edema.  Mid tenderness bilateral lateral knees at joint line Crepitus R knee  Psychiatric: He has a normal mood and affect.  Nursing note and vitals reviewed.  Results for orders placed or performed in visit on 08/15/17  Pain Mgmt, Profile 8 w/Conf, U  Result Value Ref Range   Creatinine 336.3 > or = 20. mg/dL   pH 1.61 4.5 - 9.0   Oxidant NEGATIVE <200 mcg/mL   Amphetamines NEGATIVE <500 ng/mL   medMATCH Amphetamines CONSISTENT    Benzodiazepines NEGATIVE <100 ng/mL   medMATCH Benzodiazepines  CONSISTENT    Marijuana Metabolite NEGATIVE <20 ng/mL   medMATCH Marijuana Metab CONSISTENT    Cocaine Metabolite NEGATIVE <150 ng/mL   medMATCH Cocaine Metab CONSISTENT    Opiates NEGATIVE <100 ng/mL   medMATCH Opiates CONSISTENT    Oxycodone NEGATIVE <100 ng/mL   medMATCH Oxycodone CONSISTENT    Buprenorphine, Urine NEGATIVE <5 ng/mL   medMATCH Buprenorphine CONSISTENT    MDMA NEGATIVE <500 ng/mL   [facility] MDMA CONSISTENT    Alcohol Metabolites NEGATIVE <500 ng/mL   medMATCH Alcohol Metab CONSISTENT    6 Acetylmorphine NEGATIVE <10 ng/mL   medMATCH 6 Acetylmorphine CONSISTENT       Assessment & Plan:   Problem List Items Addressed This Visit    PAC (premature atrial contraction)    H/o this, again heard today. Asxs. Continue to monitor. Consider updated EKG next visit.       Left anterior knee pain    Will get baseline xray today given longevity of symptoms. Benign exam today. Anticipate PFPS.       Relevant Orders   DG Knee Complete 4 Views Left   ADHD (attention deficit hyperactivity disorder), inattentive type - Primary    Chronic inattention improved with new regimen of metadate CD 30mg  daily, tolerating well without side effects. Will continue this dose.           No orders of the defined types were placed in this encounter.  Orders Placed This Encounter  Procedures  . DG Knee Complete 4 Views Left    Standing Status:   Future    Standing Expiration Date:   01/06/2019    Order Specific Question:   Reason for Exam (SYMPTOM  OR DIAGNOSIS REQUIRED)    Answer:   chronic left lateral knee pain    Order Specific Question:   Preferred imaging location?    Answer:   [facility]    Order Specific Question:   Radiology Contrast Protocol - do NOT remove file path    Answer:   [email or web address]    Follow up plan: Return in about 6 months (around 05/07/2018) for annual exam, prior fasting for blood work.  [name], MD

## 2017-11-04 NOTE — Assessment & Plan Note (Signed)
H/o this, again heard today. Asxs. Continue to monitor. Consider updated EKG next visit.

## 2017-11-04 NOTE — Assessment & Plan Note (Signed)
Chronic inattention improved with new regimen of metadate CD 30mg  daily, tolerating well without side effects. Will continue this dose.

## 2017-12-02 ENCOUNTER — Other Ambulatory Visit: Payer: Self-pay

## 2017-12-02 MED ORDER — METHYLPHENIDATE HCL ER (CD) 30 MG PO CPCR: 30.0000 mg | ORAL_CAPSULE | ORAL | 0 refills | Status: DC

## 2017-12-02 NOTE — Telephone Encounter (Signed)
Eprescribed.

## 2017-12-02 NOTE — Telephone Encounter (Signed)
Name of Medication:methylphenidate 30 mg CR capsule  Name of [facility] or Written Date and Quantity:#30 on 11/01/17  Last Office Visit and Type: 11/04/17 Next Office Visit and Type: 05/07/18 CPX Last Controlled Substance Agreement Date: 08/19/17 Last UDS:08/15/17

## 2017-12-02 NOTE — Telephone Encounter (Signed)
I spoke with pt and notified [facility] Pt voiced understanding.

## 2017-12-02 NOTE — Telephone Encounter (Signed)
PLEASE NOTE: All timestamps contained within this report are represented as [place] Standard Time. CONFIDENTIALTY NOTICE: This fax transmission is intended only for the addressee. It contains information that is legally privileged, confidential or otherwise protected from use or disclosure. If you are not the intended recipient, you are strictly prohibited from reviewing, disclosing, copying using or disseminating any of this information or taking any action in reliance on or regarding this information. If you have received this fax in error, please notify us immediately by telephone so that we can arrange for its return to us. Phone: [phone], Toll-Free: [phone], Fax: [phone] Page: 1 of 1 Call Id: [id number] [facility] - Client TELEPHONE ADVICE RECORD [facility] Patient Name: Victor Zimmerman Gender: Male DOB: 07/31/1985 Age: [age] Y 8 M 3 D Return Phone Number: [phone] (Primary) Address: [place] [id number] Client [facility] - Night Physician Eustaquio BoydenGutierrez, Javier - MD Contact Type Call Who Is Calling Patient / Member / Family / Caregiver Call Type Triage / Clinical Relationship To Patient Self Return Phone Number [phone] (Primary) Chief Complaint Prescription Refill or Medication Request (non symptomatic) Reason for Call Symptomatic / Request for Health Information Initial Comment Caller states he needs a refill on his methylphenidate Translation No Nurse Assessment Nurse: Gracelyn NurseWeir, RN, Misty StanleyLisa Date/Time (Eastern Time): 12/01/2017 11:05:49 AM Confirm and document reason for call. If symptomatic, describe symptoms. ---Caller states: he is needing a refill on a medication Methylphenidate (Ritalin). States he is completely out of it - took last dose this morning. Denies having symptoms that require evaluation at this time. Does  the patient have any new or worsening symptoms? ---No Please document clinical information provided and list any resource used. ---Pt. requesting refill of Methylphenidate. Informed pt. that unfortunately medication was a controlled substance and could not be called in after hours. Verbalized understanding. Advised to call office on Monday morning for refill. Guidelines Guideline Title Affirmed Question Affirmed Notes Nurse Date/Time (Eastern Time) Disp. Time Lamount Cohen(Eastern Time) Disposition Final User 12/01/2017 10:50:12 AM Attempt made - no message left Marlise EvesWeir, RN, Lisa 12/01/2017 11:10:34 AM Clinical Call Yes Gracelyn NurseWeir, RN, Misty StanleyLisa Comments User: Willy EddyLisa, Weir, RN Date/Time Lamount Cohen(Eastern Time): 12/01/2017 10:50:34 AM Unsuccessful attempt at returning call x 1. Unable to leave a message at this time.

## 2017-12-31 ENCOUNTER — Other Ambulatory Visit: Payer: Self-pay | Admitting: Family Medicine

## 2017-12-31 MED ORDER — METHYLPHENIDATE HCL ER (CD) 30 MG PO CPCR: 30.0000 mg | ORAL_CAPSULE | ORAL | 0 refills | Status: DC

## 2017-12-31 NOTE — Telephone Encounter (Signed)
Copied from CRM [id number]. Topic: Quick Communication - See Telephone Encounter >> Dec 31, 2017  9:28 AM Windy Kalata, NT wrote: CRM for notification. See Telephone encounter for: 12/31/17.  Patient is calling and requesting a refill on methylphenidate (METADATE CD) 30 MG CR capsule.  [id number] [place] [address] [place] [address] Phone: [phone] Fax: [phone]

## 2017-12-31 NOTE — Telephone Encounter (Signed)
Name of Medication: Methylphenidate 30 mg CR Name of [facility] [place] or Written Date and Quantity: #30 on 12/02/17 Last Office Visit and Type: 11/04/17 ADHD Next Office Visit and Type: 05/07/18 CPX Last Controlled Substance Agreement Date: 08/19/17 Last UDS:08/15/17

## 2017-12-31 NOTE — Telephone Encounter (Signed)
Methylphenidate (Methadate CD) 30 mg cap refill Last Refill:12/02/17 #30 Last OV: 11/04/17 PCP: Dr. Sharen Hones [facility]   [address], [place]

## 2017-12-31 NOTE — Telephone Encounter (Signed)
E perscribed 

## 2018-01-01 NOTE — Telephone Encounter (Signed)
Pt notified methylphenidate was [facility] to [facility]. Pt voiced understanding.

## 2018-01-29 ENCOUNTER — Other Ambulatory Visit: Payer: Self-pay | Admitting: Family Medicine

## 2018-01-29 MED ORDER — METHYLPHENIDATE HCL ER (CD) 30 MG PO CPCR: 30.0000 mg | ORAL_CAPSULE | ORAL | 0 refills | Status: DC

## 2018-01-29 NOTE — Telephone Encounter (Signed)
Name of Medication: methylphenidate 30 mg CR Name of [facility] [place] or Written Date and Quantity: # 30 on 12/31/17 Last Office Visit and Type: 11/04/17 ADHD Next Office Visit and Type: 05/07/18 CPX Last Controlled Substance Agreement Date: 08/19/17 Last UDS:08/15/17

## 2018-01-29 NOTE — Telephone Encounter (Signed)
Eprescribed.

## 2018-01-29 NOTE — Telephone Encounter (Signed)
Copied from CRM [id number]. Topic: Quick Communication - Rx Refill/Question >> Jan 29, 2018  1:40 PM Crist Infante wrote: Medication: methylphenidate (METADATE CD) 30 MG CR capsule  [id number] [place] [facility], [place] - [address] [phone] (Phone) [phone] (Fax) Last OV 11/04/17

## 2018-01-29 NOTE — Telephone Encounter (Signed)
Pt notified methylphenidate [facility]; pt voiced understanding.

## 2018-01-31 ENCOUNTER — Encounter: Payer: Self-pay | Admitting: Family Medicine

## 2018-01-31 ENCOUNTER — Ambulatory Visit: Payer: BLUE CROSS/BLUE SHIELD | Admitting: Family Medicine

## 2018-01-31 VITALS — BP 136/82 | HR 62 | Temp 98.6°F | Wt 286.5 lb

## 2018-01-31 DIAGNOSIS — M25562 Pain in left knee: Secondary | ICD-10-CM | POA: Diagnosis not present

## 2018-01-31 MED ORDER — DICLOFENAC SODIUM 75 MG PO TBEC: 75.0000 mg | DELAYED_RELEASE_TABLET | Freq: Two times a day (BID) | ORAL | 0 refills | Status: DC

## 2018-01-31 MED ORDER — DICLOFENAC SODIUM 1 % TD GEL: 1.0000 "application " | Freq: Three times a day (TID) | TRANSDERMAL | 1 refills | Status: DC

## 2018-01-31 NOTE — Progress Notes (Signed)
BP 136/82   Pulse 62   Temp 98.6 F (37 C) (Oral)   Wt 286 lb 8 oz (130 kg)   SpO2 98%   BMI 37.80 kg/m    CC: knee pain, left Subjective:    Patient ID: Victor Zimmerman, male    DOB: 07/09/1985, 32 y.o.   MRN: [id number]  HPI: Victor Zimmerman is a 32 y.o. male presenting on 01/31/2018 for Knee Pain (Left)   Intermittent L knee pain for years. Seen 10/2017 - thought PFPS, with reassuring xray, treated with home exercises without improvement. Pressure anterior superior knee causes sharp pain. Some popping with knee flexion. Worse with walking up stairs, ladder or incline. Denies inciting trauma/injury.  No locking or significant instability of knee.  No inciting trauma/injury.   14 lb weight loss in last 3 months! - able to better focus on healthy diet choices - with stimulant on board for ADHD.   Relevant past medical, surgical, family and social history reviewed and updated as indicated. Interim medical history since our last visit reviewed. Allergies and medications reviewed and updated. Outpatient Medications Prior to Visit  Medication Sig Dispense Refill  . acetaminophen (TYLENOL) 500 MG tablet Take 1,000 mg by mouth every 6 (six) hours as needed for pain.    Marland Kitchen aspirin-acetaminophen-caffeine (EXCEDRIN MIGRAINE) 250-250-65 MG tablet Take 2 tablets by mouth every 6 (six) hours as needed for headache.    . Azelastine HCl 137 MCG/SPRAY SOLN USE ONE (1) SPRAY IN EACH NOSTRIL TWO TIMES A DAY 30 mL 3  . fexofenadine (ALLEGRA) 180 MG tablet Take 180 mg by mouth daily as needed. Reported on 07/05/2015    . fluticasone (FLONASE) 50 MCG/ACT nasal spray USE ONE SPRAY IN EACH NOSTRIL DAILY 16 g 4  . hydrOXYzine (ATARAX/VISTARIL) 25 MG tablet Take 0.5-1 tablets (12.5-25 mg total) by mouth 3 (three) times daily as needed for anxiety. 30 tablet 0  . methylphenidate (METADATE CD) 30 MG CR capsule Take 1 capsule (30 mg total) by mouth every morning. 30 capsule 0  . omeprazole (PRILOSEC) 40 MG  capsule Take 1 capsule (40 mg total) by mouth daily. 30 capsule 4  . ibuprofen (ADVIL,MOTRIN) 200 MG tablet Take 4 tablets (800 mg total) by mouth 3 (three) times daily as needed.     No facility-administered medications prior to visit.      Per HPI unless specifically indicated in ROS section below Review of Systems     Objective:    BP 136/82   Pulse 62   Temp 98.6 F (37 C) (Oral)   Wt 286 lb 8 oz (130 kg)   SpO2 98%   BMI 37.80 kg/m   Wt Readings from Last 3 Encounters:  01/31/18 286 lb 8 oz (130 kg)  11/04/17 300 lb (136.1 kg)  09/26/17 (!) 304 lb 12 oz (138.2 kg)    Physical Exam  Constitutional: He appears well-developed and well-nourished. No distress.  Musculoskeletal: Normal range of motion. He exhibits tenderness. He exhibits no edema.  R knee WNL L knee exam: No deformity on inspection. Pain with palpation of L lateral knee at tendons above and lateral to patella  No effusion/swelling noted. FROM in flex/extension without crepitus. No popliteal fullness. Neg drawer test. Neg mcmurray test. No pain with valgus/varus stress. Discomfort with PFgrind. No abnormal patellar mobility.   Nursing note and vitals reviewed.     Assessment & Plan:   Problem List Items Addressed This Visit    Left anterior knee pain -  Primary    Exam today consistent with either bursitis or tendonitis of thigh muscles at insertion superior lateral to knee. Will Rx voltaren topical gel as well as diclofenac course. Discussed further supportive care with ice pack to knee, rest, elevation. GI precautions with NSAID discussed. Update if not improving with treatment.           Meds ordered this encounter  Medications  . diclofenac sodium (VOLTAREN) 1 % GEL    Sig: Apply 1 application topically 3 (three) times daily.    Dispense:  1 Tube    Refill:  1  . diclofenac (VOLTAREN) 75 MG EC tablet    Sig: Take 1 tablet (75 mg total) by mouth 2 (two) times daily.    Dispense:  30 tablet     Refill:  0   No orders of the defined types were placed in this encounter.   Follow up plan: Return if symptoms worsen or fail to improve.  [name], MD

## 2018-01-31 NOTE — Patient Instructions (Addendum)
I think you have bursitis or tendonitis of the left knee. Rest knee, elevate when prolonged seated.  Ice pack to knee. Treat with anti inflammatory course (diclofenac), may also use topical anti inflammatory. If no better with this, let us know for physical therapy referral.

## 2018-01-31 NOTE — Assessment & Plan Note (Addendum)
Exam today consistent with either bursitis or tendonitis of thigh muscles at insertion superior lateral to knee. Will Rx voltaren topical gel as well as diclofenac course. Discussed further supportive care with ice pack to knee, rest, elevation. GI precautions with NSAID discussed. Update if not improving with treatment.

## 2018-03-04 ENCOUNTER — Other Ambulatory Visit: Payer: Self-pay | Admitting: Family Medicine

## 2018-03-04 NOTE — Telephone Encounter (Signed)
Name of Medication: Methylphenidate [facility] [place] [facility] or Written Date and Quantity:  01/29/18, #30/0 Last Office Visit and Type:  01/31/18, acute Next Office Visit and Type:  1/15/120, CPE Last Controlled Substance Agreement Date:  Last UDS: 08/15/17

## 2018-03-04 NOTE — Telephone Encounter (Signed)
° ° °  1. Which medications need to be refilled? (please list name of each medication and dose if known) Methylphenidate 30 mg  2. Which pharmacy/location (including street and city if local pharmacy) is medication [facility] sent to?[facility]  3. Do they need a 30 day or 90 day supply? 30

## 2018-03-05 MED ORDER — METHYLPHENIDATE HCL ER (CD) 30 MG PO CPCR: 30.0000 mg | ORAL_CAPSULE | ORAL | 0 refills | Status: DC

## 2018-03-05 NOTE — Telephone Encounter (Signed)
Eprescribed.

## 2018-03-17 ENCOUNTER — Other Ambulatory Visit: Payer: Self-pay | Admitting: Family Medicine

## 2018-03-17 ENCOUNTER — Ambulatory Visit: Payer: BLUE CROSS/BLUE SHIELD | Admitting: Family Medicine

## 2018-03-17 ENCOUNTER — Encounter: Payer: Self-pay | Admitting: Family Medicine

## 2018-03-17 VITALS — BP 116/68 | HR 47 | Temp 97.9°F | Ht 73.0 in | Wt 293.8 lb

## 2018-03-17 DIAGNOSIS — J069 Acute upper respiratory infection, unspecified: Secondary | ICD-10-CM | POA: Diagnosis not present

## 2018-03-17 DIAGNOSIS — J029 Acute pharyngitis, unspecified: Secondary | ICD-10-CM | POA: Diagnosis not present

## 2018-03-17 DIAGNOSIS — B9789 Other viral agents as the cause of diseases classified elsewhere: Secondary | ICD-10-CM

## 2018-03-17 LAB — POCT RAPID STREP A (OFFICE): Rapid Strep A Screen: NEGATIVE

## 2018-03-17 NOTE — Progress Notes (Signed)
Subjective:    Patient ID: Victor Zimmerman, male    DOB: 1985/09/27, 32 y.o.   MRN: [id number]  HPI Here for cough/ST /fatigue and congestion   Started about a week ago  Working in a Nurse, learning disability get rid of it /sometimes a knot -- worse at night  Feels puny   Also cough - mild  Clearing throat a lot  Tired  A lot of nasal congestion - worse at night  Yellow nasal d/c   No sinus pain - does not feel like a sinus infection  Some general headache Does not know if he had a rash   5 kids- often exp to strep and other things    No fever  Some body aches and chills    Results for orders placed or performed in visit on 03/17/18  POCT rapid strep A  Result Value Ref Range   Rapid Strep A Screen Negative Negative    Patient Active Problem List   Diagnosis Date Noted  . Sore throat 03/17/2018  . Viral URI with cough 03/17/2018  . Left anterior knee pain 09/26/2017  . GERD (gastroesophageal reflux disease) 06/24/2015  . Stressful life event affecting family 06/24/2015  . Cold sensation of skin 01/26/2015  . Dizziness 08/12/2014  . PAC (premature atrial contraction) 06/10/2014  . Seasonal allergic rhinitis   . ADHD (attention deficit hyperactivity disorder), inattentive type 11/03/2013  . Obesity (BMI 35.0-39.9 without comorbidity)    Past Medical History:  Diagnosis Date  . Morbid obesity (HCC)   . Seasonal allergic rhinitis    and dust  . Strep pharyngitis 01/21/2015   Past Surgical History:  Procedure Laterality Date  . NO PAST SURGERIES     Social History   Tobacco Use  . Smoking status: Never Smoker  . Smokeless tobacco: Never Used  Substance Use Topics  . Alcohol use: No    Alcohol/week: 0.0 standard drinks  . Drug use: No   Family History  Problem Relation Age of Onset  . Diabetes Other        maternal side  . Hypertension Mother   . Cancer Maternal Uncle        brain  . Cancer Maternal Uncle        unknown  . Cancer  Maternal Aunt        unsure  . CAD Neg Hx    Allergies  Allergen Reactions  . Amoxicillin Other (See Comments)    All "cillin" Blisters in mouth   Current Outpatient Medications on File Prior to Visit  Medication Sig Dispense Refill  . acetaminophen (TYLENOL) 500 MG tablet Take 1,000 mg by mouth every 6 (six) hours as needed for pain.    Marland Kitchen aspirin-acetaminophen-caffeine (EXCEDRIN MIGRAINE) 250-250-65 MG tablet Take 2 tablets by mouth every 6 (six) hours as needed for headache.    Marland Kitchen azelastine (ASTELIN) 0.1 % nasal spray USE ONE (1) SPRAY IN EACH NOSTRIL TWO TIMES A DAY 30 mL 2  . diclofenac (VOLTAREN) 75 MG EC tablet Take 1 tablet (75 mg total) by mouth 2 (two) times daily. 30 tablet 0  . diclofenac sodium (VOLTAREN) 1 % GEL Apply 1 application topically 3 (three) times daily. 1 Tube 1  . fexofenadine (ALLEGRA) 180 MG tablet Take 180 mg by mouth daily as needed. Reported on 07/05/2015    . fluticasone (FLONASE) 50 MCG/ACT nasal spray USE ONE SPRAY IN EACH NOSTRIL DAILY 16 g 4  . hydrOXYzine (ATARAX/VISTARIL) 25 MG  tablet Take 0.5-1 tablets (12.5-25 mg total) by mouth 3 (three) times daily as needed for anxiety. 30 tablet 0  . methylphenidate (METADATE CD) 30 MG CR capsule Take 1 capsule (30 mg total) by mouth every morning. 30 capsule 0  . omeprazole (PRILOSEC) 40 MG capsule Take 1 capsule (40 mg total) by mouth daily. 30 capsule 4   No current facility-administered medications on file prior to visit.       Review of Systems  Constitutional: Positive for appetite change and fatigue. Negative for fever.  HENT: Positive for congestion, postnasal drip, rhinorrhea, sinus pressure, sneezing and sore throat. Negative for ear pain, trouble swallowing and voice change.   Eyes: Negative for pain and discharge.  Respiratory: Positive for cough. Negative for shortness of breath, wheezing and stridor.   Cardiovascular: Negative for chest pain.  Gastrointestinal: Negative for diarrhea, nausea and  vomiting.  Genitourinary: Negative for frequency, hematuria and urgency.  Musculoskeletal: Negative for arthralgias and myalgias.  Skin: Negative for rash.  Neurological: Positive for headaches. Negative for dizziness, weakness and light-headedness.  Psychiatric/Behavioral: Negative for confusion and dysphoric mood.       Objective:   Physical Exam  Constitutional: He appears well-developed and well-nourished. No distress.  Well appearing  HENT:  Head: Normocephalic and atraumatic.  Right Ear: Tympanic membrane and external ear normal. There is drainage. No middle ear effusion.  Left Ear: Tympanic membrane and external ear normal. There is drainage.  No middle ear effusion.  Mouth/Throat: Mucous membranes are normal. Posterior oropharyngeal erythema present. No oropharyngeal exudate or posterior oropharyngeal edema. No tonsillar exudate.  Nares are injected and congested  No sinus tenderness Clear rhinorrhea and post nasal drip  Baseline large tonsils w/o erythema or swelling or exudate  Eyes: Pupils are equal, round, and reactive to light. Conjunctivae and EOM are normal. Right eye exhibits no discharge. Left eye exhibits no discharge.  Neck: Normal range of motion. Neck supple.  Cardiovascular: Normal rate and normal heart sounds.  Pulmonary/Chest: Effort normal and breath sounds normal. No respiratory distress. He has no wheezes. He has no rales. He exhibits no tenderness.  Lymphadenopathy:    He has no cervical adenopathy.  Neurological: He is alert.  Skin: Skin is warm and dry. No rash noted.  Psychiatric: He has a normal mood and affect.          Assessment & Plan:   Problem List Items Addressed This Visit      Respiratory   Viral URI with cough - Primary    Reassuring exam and neg RST today  Disc course of viral illness and handout given  Disc symptomatic care - see instructions on AVS  Continue otc analgesic and salt water gargles Try saline nasal  rinse Antihistamine/expectorant prn Update if not starting to improve in a week or if worsening          Other   Sore throat   Relevant Orders   POCT rapid strep A (Completed)

## 2018-03-17 NOTE — Assessment & Plan Note (Signed)
Reassuring exam and neg RST today  Disc course of viral illness and handout given  Disc symptomatic care - see instructions on AVS  Continue otc analgesic and salt water gargles Try saline nasal rinse Antihistamine/expectorant prn Update if not starting to improve in a week or if worsening

## 2018-03-17 NOTE — Patient Instructions (Addendum)
Strep test is negative   Continue the nasal sprays  Start back on allegra - it may help slow mucous production   Drink lots of fluids  Rest when you can  Gargle with salt water if you can  Also nasal saline over the counter   For congestion with cough I like mucinex DM   Watch for fever or facial pain   Update if not starting to improve in a week or if worsening

## 2018-04-07 ENCOUNTER — Other Ambulatory Visit: Payer: Self-pay

## 2018-04-07 NOTE — Telephone Encounter (Signed)
Name of Medication: methylphenidate 30 mg CR [facility] [place] Written Date and Quantity: # 30 on 03/05/18 Last Office Visit and Type: 11/04/17 ADHD Next Office Visit and Type: 05/07/18 CPX Last Controlled Substance Agreement Date:08/19/17  Last UDS:08/15/17

## 2018-04-08 ENCOUNTER — Ambulatory Visit: Payer: BLUE CROSS/BLUE SHIELD | Admitting: Family Medicine

## 2018-04-08 ENCOUNTER — Encounter: Payer: Self-pay | Admitting: Family Medicine

## 2018-04-08 VITALS — BP 118/68 | HR 58 | Temp 98.4°F | Ht 73.0 in | Wt 294.5 lb

## 2018-04-08 DIAGNOSIS — M25522 Pain in left elbow: Secondary | ICD-10-CM | POA: Diagnosis not present

## 2018-04-08 DIAGNOSIS — M25562 Pain in left knee: Secondary | ICD-10-CM

## 2018-04-08 MED ORDER — METHYLPHENIDATE HCL ER (CD) 30 MG PO CPCR: 30.0000 mg | ORAL_CAPSULE | ORAL | 0 refills | Status: DC

## 2018-04-08 MED ORDER — DICLOFENAC SODIUM 75 MG PO TBEC: 75.0000 mg | DELAYED_RELEASE_TABLET | Freq: Two times a day (BID) | ORAL | 3 refills | Status: DC | PRN

## 2018-04-08 NOTE — Assessment & Plan Note (Signed)
Not consistent with epicondylitis or with neuritis. ?elbow bursitis - Rx diclofenac as above.

## 2018-04-08 NOTE — Assessment & Plan Note (Signed)
?  persistent bursitis/tendonitis of thigh muscles at insertion superior and lateral to knee. Oral diclofenac was helpful - but may have been too short a course - will extend to 3-4 wks BID then QD PRN. Further supportive care reviewed. Offered PT course - pt declines at this time but will let me know if he changes his mind.

## 2018-04-08 NOTE — Patient Instructions (Addendum)
Try OTC topical cream like aspercream to knee and elbow.  Oral diclofenac refilled - take twice daily for next 2-3 weeks then back down to daily as needed.  If no better with extended anti inflammatory course, let us know for physical therapy referral.

## 2018-04-08 NOTE — Telephone Encounter (Signed)
Eprescribed.

## 2018-04-08 NOTE — Progress Notes (Signed)
BP 118/68 (BP Location: Left Arm, Patient Position: Sitting, Cuff Size: Large)   Pulse (!) 58   Temp 98.4 F (36.9 C) (Oral)   Ht 6\' 1"  (1.854 m)   Wt 294 lb 8 oz (133.6 kg)   SpO2 96%   BMI 38.85 kg/m    CC: knee pain Subjective:    Patient ID: Quillian QuinceMichael Cespedes, male    DOB: 03/26/1986, 32 y.o.   MRN: [id number]  HPI: Quillian QuinceMichael Surgeon is a 32 y.o. male presenting on 04/08/2018 for Knee Pain (C/o left knee pain for yrs. Seen about 1.5 mos ago. Given NSAID, which helped. ) and Elbow Pain (C/o left elbow pain since starting Adderall but has increased when strength was increased. Says NSAID he was taking for knee pain helped elbow also. )   See prior note for details.  Intermittent L knee pain for years previously thought PFPS with reassuring xrays treated with NSAID. On re evaluation, thought bursitis or tendonitis of thigh muscles at insertion superior to lateral knee treated with voltaren topical gel and oral anti inflammatory 2 wk course with benefit (may have ended too early). Insurance didn't cover topical cream. Home exercise program didn't help.   L elbow pain started when metadate CR dose was increased. Previously thought tennis elbow. No paresthesias, tingling or numbness or electrical shock pain.   Relevant past medical, surgical, family and social history reviewed and updated as indicated. Interim medical history since our last visit reviewed. Allergies and medications reviewed and updated. Outpatient Medications Prior to Visit  Medication Sig Dispense Refill  . acetaminophen (TYLENOL) 500 MG tablet Take 1,000 mg by mouth every 6 (six) hours as needed for pain.    Marland Kitchen. aspirin-acetaminophen-caffeine (EXCEDRIN MIGRAINE) 250-250-65 MG tablet Take 2 tablets by mouth every 6 (six) hours as needed for headache.    Marland Kitchen. azelastine (ASTELIN) 0.1 % nasal spray USE ONE (1) SPRAY IN EACH NOSTRIL TWO TIMES A DAY 30 mL 2  . fexofenadine (ALLEGRA) 180 MG tablet Take 180 mg by mouth daily as  needed. Reported on 07/05/2015    . fluticasone (FLONASE) 50 MCG/ACT nasal spray USE ONE SPRAY IN EACH NOSTRIL DAILY 16 g 4  . hydrOXYzine (ATARAX/VISTARIL) 25 MG tablet Take 0.5-1 tablets (12.5-25 mg total) by mouth 3 (three) times daily as needed for anxiety. 30 tablet 0  . methylphenidate (METADATE CD) 30 MG CR capsule Take 1 capsule (30 mg total) by mouth every morning. 30 capsule 0  . omeprazole (PRILOSEC) 40 MG capsule Take 1 capsule (40 mg total) by mouth daily. 30 capsule 4  . diclofenac (VOLTAREN) 75 MG EC tablet Take 1 tablet (75 mg total) by mouth 2 (two) times daily. 30 tablet 0  . diclofenac sodium (VOLTAREN) 1 % GEL Apply 1 application topically 3 (three) times daily. 1 Tube 1   No facility-administered medications prior to visit.      Per HPI unless specifically indicated in ROS section below Review of Systems     Objective:    BP 118/68 (BP Location: Left Arm, Patient Position: Sitting, Cuff Size: Large)   Pulse (!) 58   Temp 98.4 F (36.9 C) (Oral)   Ht 6\' 1"  (1.854 m)   Wt 294 lb 8 oz (133.6 kg)   SpO2 96%   BMI 38.85 kg/m   Wt Readings from Last 3 Encounters:  04/08/18 294 lb 8 oz (133.6 kg)  03/17/18 293 lb 12 oz (133.2 kg)  01/31/18 286 lb 8 oz (130 kg)  Physical Exam Vitals signs and nursing note reviewed.  Constitutional:      General: He is not in acute distress.    Appearance: Normal appearance.  Musculoskeletal: Normal range of motion.        General: No swelling.     Comments: R elbow WNL L elbow FROM flexion/extension, no significant pain or swelling at olecranon bursa or at lateral/medial epicondyle. Tender just lateral to olecranon posterior elbow R knee WNL L knee: No deformity on inspection. Mild pain to palpation of L lateral knee at tendons above and lateral to patella  No effusion/swelling noted. FROM in flex/extension without crepitus.  Neurological:     Mental Status: He is alert.   DG Knee Complete 4 Views Left CLINICAL DATA:   Chronic lateral left knee pain.  EXAM: LEFT KNEE - COMPLETE 4+ VIEW  COMPARISON:  None.  FINDINGS: No evidence of fracture, dislocation, or joint effusion. No evidence of arthropathy or other focal bone abnormality. Soft tissues are unremarkable.  IMPRESSION: Negative.  Electronically Signed   By: Francene Boyers M.D.   On: 11/04/2017 10:57      Assessment & Plan:   Problem List Items Addressed This Visit    Left elbow pain    Not consistent with epicondylitis or with neuritis. ?elbow bursitis - Rx diclofenac as above.       Left anterior knee pain - Primary    ?persistent bursitis/tendonitis of thigh muscles at insertion superior and lateral to knee. Oral diclofenac was helpful - but may have been too short a course - will extend to 3-4 wks BID then QD PRN. Further supportive care reviewed. Offered PT course - pt declines at this time but will let me know if he changes his mind.           Meds ordered this encounter  Medications  . diclofenac (VOLTAREN) 75 MG EC tablet    Sig: Take 1 tablet (75 mg total) by mouth 2 (two) times daily as needed for moderate pain.    Dispense:  60 tablet    Refill:  3   No orders of the defined types were placed in this encounter.   Follow up plan: Return if symptoms worsen or fail to improve.  [name], MD

## 2018-04-22 ENCOUNTER — Other Ambulatory Visit: Payer: Self-pay | Admitting: Family Medicine

## 2018-05-01 ENCOUNTER — Other Ambulatory Visit: Payer: Self-pay | Admitting: Family Medicine

## 2018-05-01 DIAGNOSIS — E669 Obesity, unspecified: Secondary | ICD-10-CM

## 2018-05-02 ENCOUNTER — Other Ambulatory Visit: Payer: Self-pay

## 2018-05-07 ENCOUNTER — Encounter: Payer: Self-pay | Admitting: Family Medicine

## 2018-05-13 ENCOUNTER — Other Ambulatory Visit: Payer: Self-pay

## 2018-05-13 NOTE — Telephone Encounter (Signed)
Name of Medication: methylphenidate 30 mg CR Name of [facility]acy: [facility] [place] or Written Date and Quantity: # 30 on 04/08/18 Last Office Visit and Type:11/04/17 ADHD & acute 04/08/18 Next Office Visit and Type: none scheduled Last Controlled Substance Agreement Date: 08/19/17 Last UDS:08/15/17

## 2018-05-14 MED ORDER — METHYLPHENIDATE HCL ER (CD) 30 MG PO CPCR: 30.0000 mg | ORAL_CAPSULE | ORAL | 0 refills | Status: DC

## 2018-05-14 NOTE — Telephone Encounter (Signed)
Eprescribed.

## 2018-06-13 ENCOUNTER — Other Ambulatory Visit: Payer: Self-pay | Admitting: Family Medicine

## 2018-07-26 ENCOUNTER — Other Ambulatory Visit: Payer: Self-pay | Admitting: Family Medicine

## 2018-10-26 ENCOUNTER — Other Ambulatory Visit: Payer: Self-pay | Admitting: Family Medicine

## 2018-12-16 ENCOUNTER — Other Ambulatory Visit: Payer: Self-pay | Admitting: Family Medicine

## 2018-12-16 NOTE — Telephone Encounter (Signed)
LOV 04/08/2018 for acute visit. No future appointments. Does patient need a follow up?

## 2019-01-11 ENCOUNTER — Other Ambulatory Visit: Payer: Self-pay | Admitting: Family Medicine

## 2019-08-15 ENCOUNTER — Other Ambulatory Visit: Payer: Self-pay | Admitting: Family Medicine

## 2019-09-15 ENCOUNTER — Other Ambulatory Visit: Payer: Self-pay | Admitting: Family Medicine

## 2019-10-25 ENCOUNTER — Other Ambulatory Visit: Payer: Self-pay | Admitting: Family Medicine

## 2019-10-27 ENCOUNTER — Encounter: Payer: Self-pay | Admitting: Family Medicine

## 2019-10-27 ENCOUNTER — Other Ambulatory Visit: Payer: Self-pay

## 2019-10-27 ENCOUNTER — Ambulatory Visit (INDEPENDENT_AMBULATORY_CARE_PROVIDER_SITE_OTHER): Payer: 59 | Admitting: Family Medicine

## 2019-10-27 VITALS — BP 116/78 | HR 57 | Temp 97.7°F | Ht 73.0 in | Wt 300.4 lb

## 2019-10-27 DIAGNOSIS — E669 Obesity, unspecified: Secondary | ICD-10-CM

## 2019-10-27 DIAGNOSIS — F900 Attention-deficit hyperactivity disorder, predominantly inattentive type: Secondary | ICD-10-CM | POA: Diagnosis not present

## 2019-10-27 DIAGNOSIS — J302 Other seasonal allergic rhinitis: Secondary | ICD-10-CM | POA: Diagnosis not present

## 2019-10-27 DIAGNOSIS — Z0000 Encounter for general adult medical examination without abnormal findings: Secondary | ICD-10-CM | POA: Diagnosis not present

## 2019-10-27 LAB — COMPREHENSIVE METABOLIC PANEL
ALT: 16 U/L (ref 0–53)
AST: 16 U/L (ref 0–37)
Albumin: 4.6 g/dL (ref 3.5–5.2)
Alkaline Phosphatase: 39 U/L (ref 39–117)
BUN: 15 mg/dL (ref 6–23)
CO2: 29 mEq/L (ref 19–32)
Calcium: 9.5 mg/dL (ref 8.4–10.5)
Chloride: 105 mEq/L (ref 96–112)
Creatinine, Ser: 1.05 mg/dL (ref 0.40–1.50)
GFR: 80.58 mL/min (ref >60.00)
Glucose, Bld: 93 mg/dL (ref 70–99)
Potassium: 4.2 mEq/L (ref 3.5–5.1)
Sodium: 141 mEq/L (ref 135–145)
Total Bilirubin: 0.3 mg/dL (ref 0.2–1.2)
Total Protein: 6.9 g/dL (ref 6.0–8.3)

## 2019-10-27 LAB — LIPID PANEL
Cholesterol: 173 mg/dL (ref 0–200)
HDL: 34.5 mg/dL — ABNORMAL LOW (ref >39.00)
LDL Cholesterol: 103 mg/dL — ABNORMAL HIGH (ref 0–99)
NonHDL: 138.05
Total CHOL/HDL Ratio: 5
Triglycerides: 174 mg/dL — ABNORMAL HIGH (ref 0.0–149.0)
VLDL: 34.8 mg/dL (ref 0.0–40.0)

## 2019-10-27 LAB — TSH: TSH: 15.52 u[IU]/mL — ABNORMAL HIGH (ref 0.35–4.50)

## 2019-10-27 MED ORDER — AZELASTINE HCL 0.1 % NA SOLN: NASAL | 6 refills | Status: DC

## 2019-10-27 MED ORDER — OMEPRAZOLE 40 MG PO CPDR: 40.0000 mg | DELAYED_RELEASE_CAPSULE | Freq: Every day | ORAL | 3 refills | Status: DC

## 2019-10-27 NOTE — Assessment & Plan Note (Signed)
Encouraged healthy diet and lifestyle changes for sustainable weight loss. Check TSH. Discussed keto diet, mediterranean diet, [employer], and intermittent fasting. Planning to start running program.

## 2019-10-27 NOTE — Assessment & Plan Note (Signed)
Preventative protocols reviewed and updated unless pt declined. Discussed healthy diet and lifestyle.  

## 2019-10-27 NOTE — Assessment & Plan Note (Signed)
Stable period on astelin/flonase

## 2019-10-27 NOTE — Progress Notes (Signed)
This visit was conducted in person.  BP 116/78 (BP Location: Left Arm, Patient Position: Sitting, Cuff Size: Large)   Pulse (!) 57   Temp 97.7 F (36.5 C) (Temporal)   Ht 6\' 1"  (1.854 m)   Wt (!) 300 lb 6 oz (136.2 kg)   SpO2 96%   BMI 39.63 kg/m    CC: CPE Subjective:    Patient ID: Victor Zimmerman, male    DOB: 1986-03-14, 34 y.o.   MRN: [id number]  HPI: Victor Zimmerman is a 34 y.o. male presenting on 10/27/2019 for Annual Exam (Needs health form completed. )   ADHD - adderall XR caused myalgias/arthralgias. Metadate CD 30mg  daily better tolerated - but became ineffective and caused joint pains at higher dose. Previously tried wellbutrin and strattera.   Knee pain - saw ortho, s/p PT, on PRN meloxicam 15mg .  Continues omeprazole 40mg  daily, worsening GERD at night if misses med. No dysphagia or early satiety.  Continues astelin and flonase regularly.   Preventative: Flu shot - declines, bad reaction in the past  Tdap 2015  COVID vaccine - declines for now Seat belt use discussed Sunscreen use discussed. No changing moles on skin Non smoker Alcohol - none Dentist seen this year - planed dental work  Eye exam Q2 yrs  Caffeine: 3-5 cups/day Lives with wife, 5 children  Occupation: , started own business  Edu: HS  Activity: enjoys bicycling,  Diet: good water, vegetables daily - planning to start keto diet.      Relevant past medical, surgical, family and social history reviewed and updated as indicated. Interim medical history since our last visit reviewed. Allergies and medications reviewed and updated. Outpatient Medications Prior to Visit  Medication Sig Dispense Refill  . acetaminophen (TYLENOL) 500 MG tablet Take 1,000 mg by mouth every 6 (six) hours as needed for pain.    aspirin-acetaminophen-caffeine (EXCEDRIN MIGRAINE) 250-250-65 MG tablet Take 2 tablets by mouth every 6 (six) hours as needed for headache.    . fluticasone (FLONASE) 50 MCG/ACT  nasal spray USE ONE SPRAY IN EACH NOSTRIL DAILY 16 mL 1  . azelastine (ASTELIN) 0.1 % nasal spray USE ONE (1) SPRAY IN EACH NOSTRIL TWO TIMES A DAY 30 mL 2  . diclofenac (VOLTAREN) 75 MG EC tablet Take 1 tablet (75 mg total) by mouth 2 (two) times daily as needed for moderate pain. 60 tablet 3  . omeprazole (PRILOSEC) 40 MG capsule TAKE 1 CAPSULE BY MOUTH EVERY DAY 30 capsule 0  . meloxicam (MOBIC) 15 MG tablet Take 15 mg by mouth daily.    . fexofenadine (ALLEGRA) 180 MG tablet Take 180 mg by mouth daily as needed. Reported on 07/05/2015    . hydrOXYzine (ATARAX/VISTARIL) 25 MG tablet Take 0.5-1 tablets (12.5-25 mg total) by mouth 3 (three) times daily as needed for anxiety. 30 tablet 0  . methylphenidate (METADATE CD) 30 MG CR capsule Take 1 capsule (30 mg total) by mouth every morning. 30 capsule 0   No facility-administered medications prior to visit.     Per HPI unless specifically indicated in ROS section below Review of Systems  Constitutional: Negative for activity change, appetite change, chills, fatigue, fever and unexpected weight change.  HENT: Negative for hearing loss.   Eyes: Negative for visual disturbance.  Respiratory: Negative for cough, chest tightness, shortness of breath and wheezing.   Cardiovascular: Negative for chest pain, palpitations and leg swelling.  Gastrointestinal: Negative for abdominal distention, abdominal pain, blood in stool, constipation,  diarrhea, nausea and vomiting.  Genitourinary: Negative for difficulty urinating and hematuria.  Musculoskeletal: Negative for arthralgias, myalgias and neck pain.  Skin: Negative for rash.  Neurological: Negative for dizziness, seizures, syncope and headaches.  Hematological: Negative for adenopathy. Does not bruise/bleed easily.  Psychiatric/Behavioral: Negative for dysphoric mood. The patient is not nervous/anxious.    Objective:  BP 116/78 (BP Location: Left Arm, Patient Position: Sitting, Cuff Size: Large)    Pulse (!) 57   Temp 97.7 F (36.5 C) (Temporal)   Ht 6\' 1"  (1.854 m)   Wt (!) 300 lb 6 oz (136.2 kg)   SpO2 96%   BMI 39.63 kg/m   Wt Readings from Last 3 Encounters:  10/27/19 (!) 300 lb 6 oz (136.2 kg)  04/08/18 294 lb 8 oz (133.6 kg)  03/17/18 293 lb 12 oz (133.2 kg)      Physical Exam Vitals and nursing note reviewed.  Constitutional:      General: He is not in acute distress.    Appearance: Normal appearance. He is well-developed. He is obese. He is not ill-appearing.  HENT:     Head: Normocephalic and atraumatic.     Right Ear: Hearing, tympanic membrane, ear canal and external ear normal.     Left Ear: Hearing, tympanic membrane, ear canal and external ear normal.     Mouth/Throat:     Pharynx: Uvula midline.  Eyes:     General: No scleral icterus.    Extraocular Movements: Extraocular movements intact.     Conjunctiva/sclera: Conjunctivae normal.     Pupils: Pupils are equal, round, and reactive to light.  Neck:     Thyroid: No thyroid mass, thyromegaly or thyroid tenderness.  Cardiovascular:     Rate and Rhythm: Normal rate and regular rhythm.     Pulses: Normal pulses.          Radial pulses are 2+ on the right side and 2+ on the left side.     Heart sounds: Normal heart sounds. No murmur heard.   Pulmonary:     Effort: Pulmonary effort is normal. No respiratory distress.     Breath sounds: Normal breath sounds. No wheezing, rhonchi or rales.  Abdominal:     General: Abdomen is flat. Bowel sounds are normal. There is no distension.     Palpations: Abdomen is soft. There is no mass.     Tenderness: There is no abdominal tenderness. There is no guarding or rebound.     Hernia: No hernia is present.  Musculoskeletal:        General: Normal range of motion.     Cervical back: Normal range of motion and neck supple.     Right lower leg: No edema.     Left lower leg: No edema.  Lymphadenopathy:     Cervical: No cervical adenopathy.  Skin:    General: Skin is  warm and dry.     Findings: No rash.  Neurological:     General: No focal deficit present.     Mental Status: He is alert and oriented to person, place, and time.     Comments: CN grossly intact, station and gait intact  Psychiatric:        Mood and Affect: Mood normal.        Behavior: Behavior normal.        Thought Content: Thought content normal.        Judgment: Judgment normal.       Assessment & Plan:  This visit occurred during the SARS-CoV-2 public health emergency.  Safety protocols were in place, including screening questions prior to the visit, additional usage of staff PPE, and extensive cleaning of exam room while observing appropriate contact time as indicated for disinfecting solutions.   Problem List Items Addressed This Visit    Seasonal allergic rhinitis    Stable period on astelin/flonase      Obesity (BMI 35.0-39.9 without comorbidity)    Encouraged healthy diet and lifestyle changes for sustainable weight loss. Check TSH. Discussed keto diet, mediterranean diet, [employer], and intermittent fasting. Planning to start running program.       Relevant Orders   Lipid panel   Comprehensive metabolic panel   TSH   Health maintenance examination - Primary    Preventative protocols reviewed and updated unless pt declined. Discussed healthy diet and lifestyle.       ADHD (attention deficit hyperactivity disorder), inattentive type    Difficulty with all meds tried to date. Managing off meds.           Meds ordered this encounter  Medications  . azelastine (ASTELIN) 0.1 % nasal spray    Sig: USE ONE (1) SPRAY IN EACH NOSTRIL TWO TIMES A DAY    Dispense:  30 mL    Refill:  6  . omeprazole (PRILOSEC) 40 MG capsule    Sig: Take 1 capsule (40 mg total) by mouth daily.    Dispense:  90 capsule    Refill:  3   Orders Placed This Encounter  Procedures  . Lipid panel  . Comprehensive metabolic panel  . TSH    Patient instructions: You are doing  well today. Labs today.  Consider COVID vaccines.  Work on [employer] and regular exercise routine - AHA recommends 150 min/wk of moderate intensity aerobic exercise.  Return as needed or in 1 year for next physical.   Follow up plan: Return in about 1 year (around 10/26/2020) for annual exam, prior fasting for blood work.  [name], MD

## 2019-10-27 NOTE — Patient Instructions (Addendum)
You are doing well today. Labs today.  Consider COVID vaccines.  Work on [employer] and regular exercise routine - AHA recommends 150 min/wk of moderate intensity aerobic exercise.  Return as needed or in 1 year for next physical.    Health Maintenance, Male Adopting a healthy lifestyle and getting preventive care are important in promoting health and wellness. Ask your health care provider about:  The right schedule for you to have regular tests and exams.  Things you can do on your own to prevent diseases and keep yourself healthy. What should I know about diet, weight, and exercise? Eat a healthy diet   Eat a diet that includes plenty of vegetables, fruits, low-fat dairy products, and lean protein.  Do not eat a lot of foods that are high in solid fats, added sugars, or sodium. Maintain a healthy weight Body mass index (BMI) is a measurement that can be used to identify possible weight problems. It estimates body fat based on height and weight. Your health care provider can help determine your BMI and help you achieve or maintain a healthy weight. Get regular exercise Get regular exercise. This is one of the most important things you can do for your health. Most adults should:  Exercise for at least 150 minutes each week. The exercise should increase your heart rate and make you sweat (moderate-intensity exercise).  Do strengthening exercises at least twice a week. This is in addition to the moderate-intensity exercise.  Spend less time sitting. Even light physical activity can be beneficial. Watch cholesterol and blood lipids Have your blood tested for lipids and cholesterol at 35 years of age, then have this test every 5 years. You may need to have your cholesterol levels checked more often if:  Your lipid or cholesterol levels are high.  You are older than 35 years of age.  You are at high risk for heart disease. What should I know about cancer screening? Many types of  cancers can be detected early and may often be prevented. Depending on your health history and family history, you may need to have cancer screening at various ages. This may include screening for:  Colorectal cancer.  Prostate cancer.  Skin cancer.  Lung cancer. What should I know about heart disease, diabetes, and high blood pressure? Blood pressure and heart disease  High blood pressure causes heart disease and increases the risk of stroke. This is more likely to develop in people who have high blood pressure readings, are of African descent, or are overweight.  Talk with your health care provider about your target blood pressure readings.  Have your blood pressure checked: ? Every 3-5 years if you are 46-65 years of age. ? Every year if you are 49 years old or older.  If you are between the ages of 2 and 48 and are a current or former smoker, ask your health care provider if you should have a one-time screening for abdominal aortic aneurysm (AAA). Diabetes Have regular diabetes screenings. This checks your fasting blood sugar level. Have the screening done:  Once every three years after age 33 if you are at a normal weight and have a low risk for diabetes.  More often and at a younger age if you are overweight or have a high risk for diabetes. What should I know about preventing infection? Hepatitis B If you have a higher risk for hepatitis B, you should be screened for this virus. Talk with your health care provider to find  out if you are at risk for hepatitis B infection. Hepatitis C Blood testing is recommended for:  Everyone born from [birth year] through 1966.  Anyone with known risk factors for hepatitis C. Sexually transmitted infections (STIs)  You should be screened each year for STIs, including gonorrhea and chlamydia, if: ? You are sexually active and are younger than 35 years of age. ? You are older than 35 years of age and your health care provider tells you that you  are at risk for this type of infection. ? Your sexual activity has changed since you were last screened, and you are at increased risk for chlamydia or gonorrhea. Ask your health care provider if you are at risk.  Ask your health care provider about whether you are at high risk for HIV. Your health care provider may recommend a prescription medicine to help prevent HIV infection. If you choose to take medicine to prevent HIV, you should first get tested for HIV. You should then be tested every 3 months for as long as you are taking the medicine. Follow these instructions at home: Lifestyle  Do not use any products that contain nicotine or tobacco, such as cigarettes, e-cigarettes, and chewing tobacco. If you need help quitting, ask your health care provider.  Do not use street drugs.  Do not share needles.  Ask your health care provider for help if you need support or information about quitting drugs. Alcohol use  Do not drink alcohol if your health care provider tells you not to drink.  If you drink alcohol: ? Limit how much you have to 0-2 drinks a day. ? Be aware of how much alcohol is in your drink. In the [place] one drink equals one 12 oz bottle of beer (355 mL), one 5 oz glass of wine (148 mL), or one 1 oz glass of hard liquor (44 mL). General instructions  Schedule regular health, dental, and eye exams.  Stay current with your vaccines.  Tell your health care provider if: ? You often feel depressed. ? You have ever been abused or do not feel safe at home. Summary  Adopting a healthy lifestyle and getting preventive care are important in promoting health and wellness.  Follow your health care provider's instructions about healthy diet, exercising, and getting tested or screened for diseases.  Follow your health care provider's instructions on monitoring your cholesterol and blood pressure. This information is not intended to replace advice given to you by your health care  provider. Make sure you discuss any questions you have with your health care provider. Document Revised: 04/02/2018 Document Reviewed: 04/02/2018 Elsevier Patient Education  2020 [employer].

## 2019-10-27 NOTE — Assessment & Plan Note (Signed)
Difficulty with all meds tried to date. Managing off meds.

## 2019-10-28 ENCOUNTER — Encounter: Payer: Self-pay | Admitting: Family Medicine

## 2019-10-28 ENCOUNTER — Other Ambulatory Visit (INDEPENDENT_AMBULATORY_CARE_PROVIDER_SITE_OTHER): Payer: 59

## 2019-10-28 DIAGNOSIS — R7989 Other specified abnormal findings of blood chemistry: Secondary | ICD-10-CM

## 2019-10-28 LAB — T3, FREE: T3, Free: 3.6 pg/mL (ref 2.3–4.2)

## 2019-10-28 LAB — T4, FREE: Free T4: 0.73 ng/dL (ref 0.60–1.60)

## 2019-10-29 ENCOUNTER — Encounter: Payer: Self-pay | Admitting: Family Medicine

## 2019-10-29 ENCOUNTER — Other Ambulatory Visit: Payer: Self-pay | Admitting: Family Medicine

## 2019-10-29 MED ORDER — LEVOTHYROXINE SODIUM 50 MCG PO TABS
50.0000 ug | ORAL_TABLET | Freq: Every day | ORAL | 6 refills | Status: DC
Start: 2019-10-29 — End: 2020-01-02

## 2019-11-07 IMAGING — DX DG KNEE COMPLETE 4+V*L*
4 series · 4 of 4 positions shown · non-contrast
Comparison: None.

CLINICAL DATA: Chronic lateral left knee pain.

EXAM:
LEFT KNEE - COMPLETE 4+ VIEW

[knee ap]
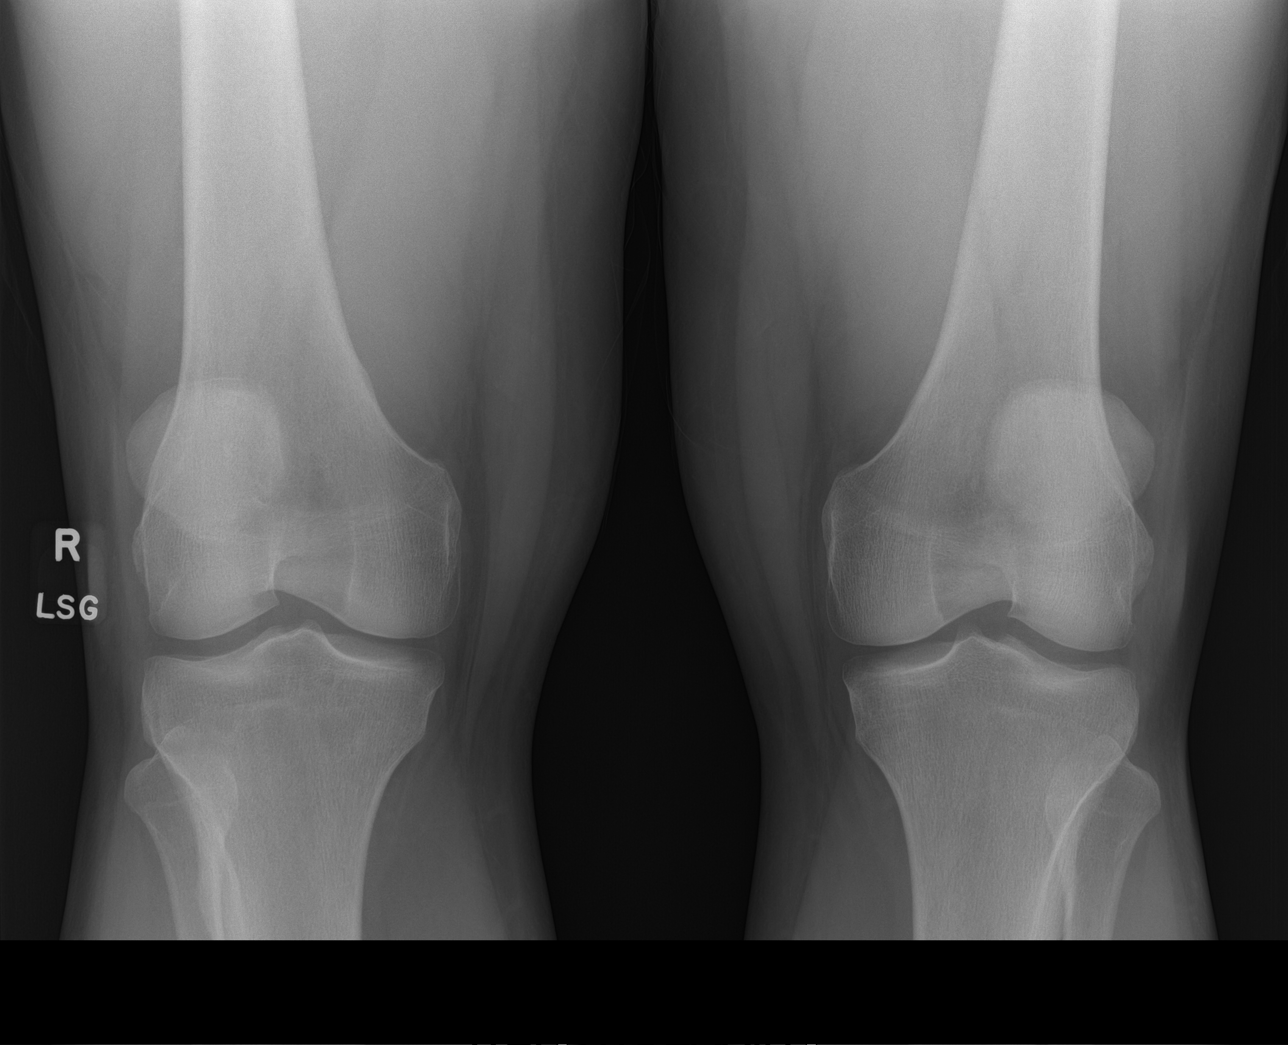

[knee tunnel]
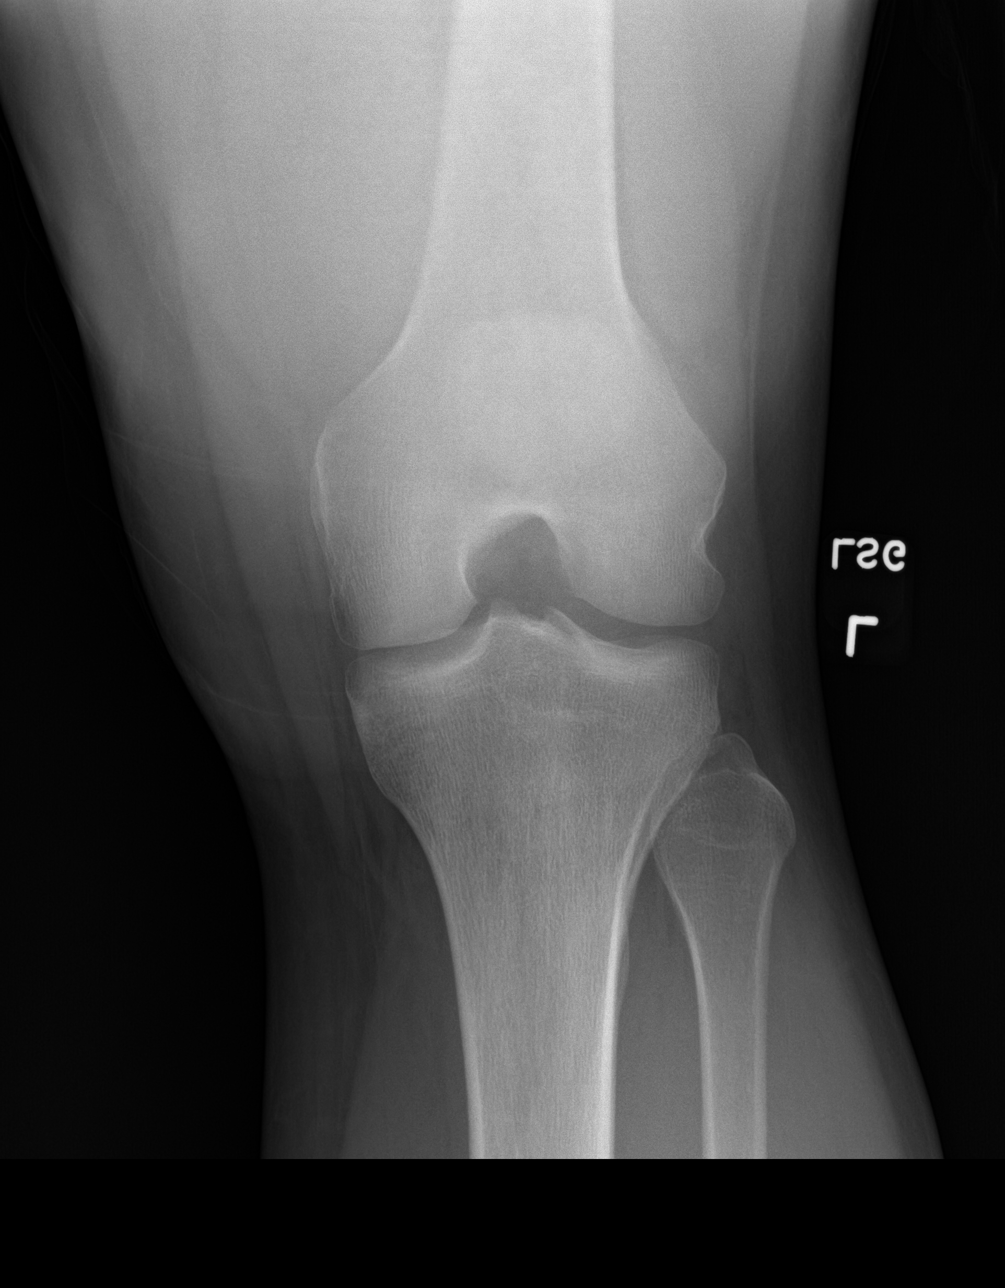

[knee lat]
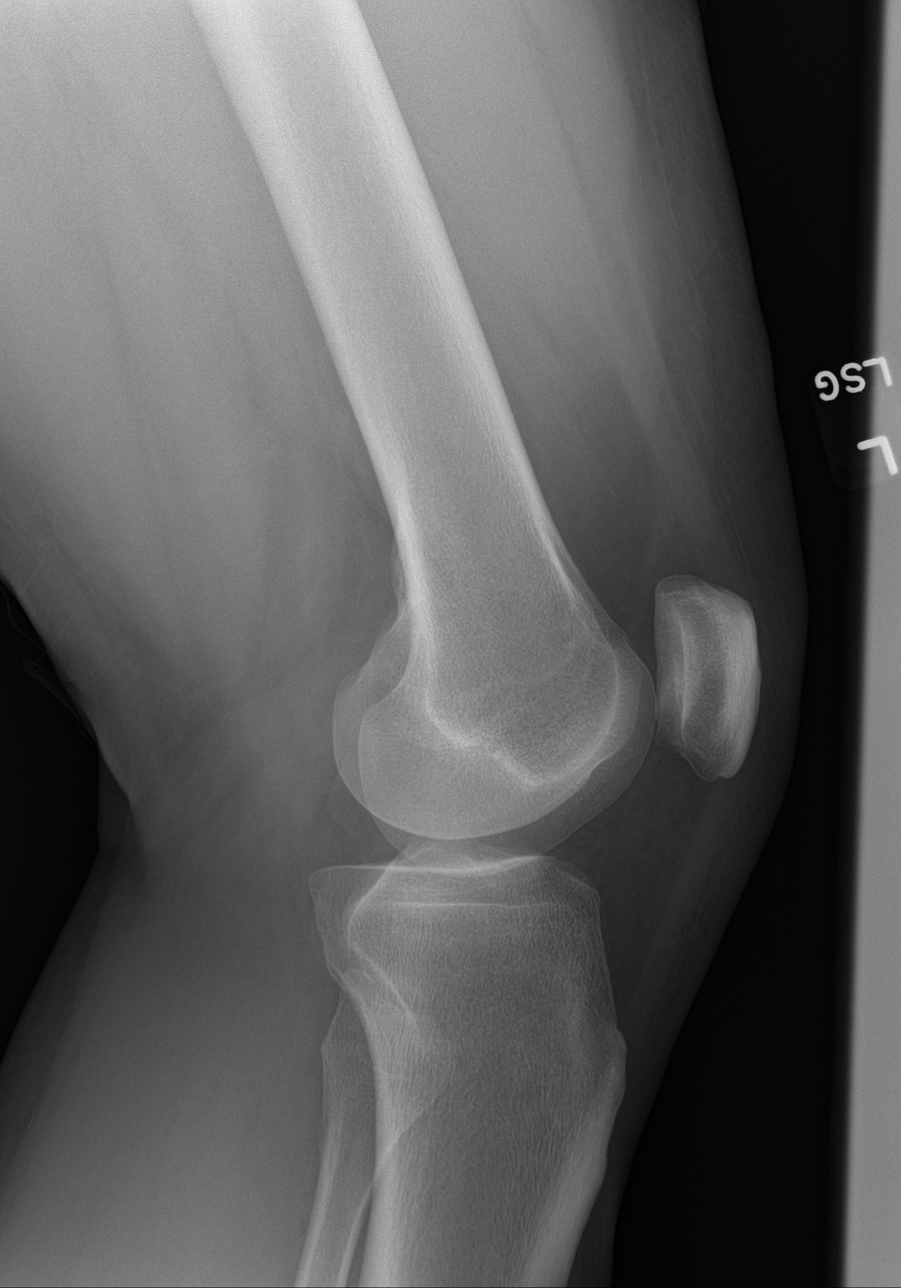

[patella skyline]
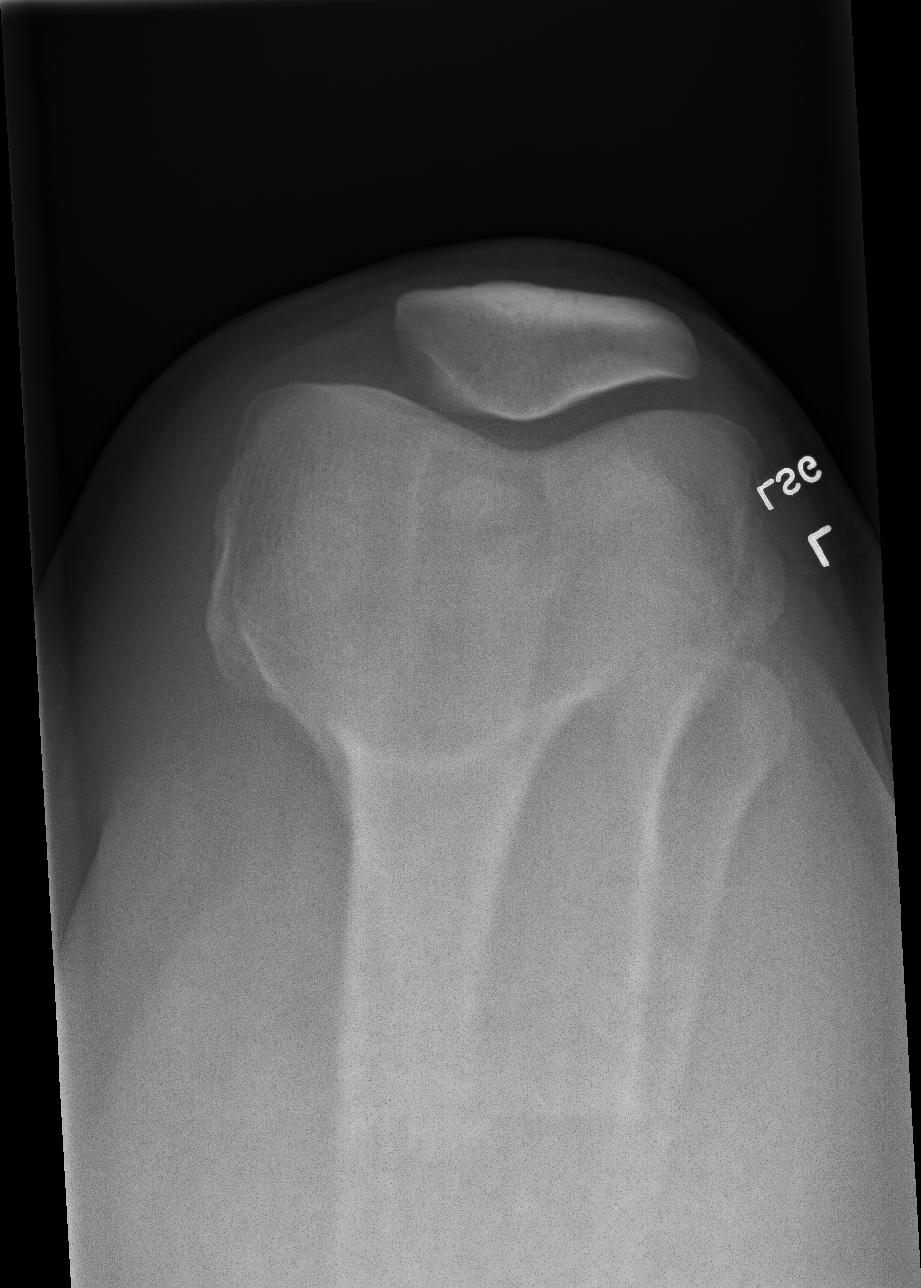

[4 of 4 positions shown; findings below may reference images not displayed]

FINDINGS: No evidence of fracture, dislocation, or joint effusion. No evidence
of arthropathy or other focal bone abnormality. Soft tissues are
unremarkable.
IMPRESSION: Negative.

## 2020-01-01 ENCOUNTER — Encounter: Payer: Self-pay | Admitting: Family Medicine

## 2020-01-01 ENCOUNTER — Other Ambulatory Visit: Payer: Self-pay

## 2020-01-01 ENCOUNTER — Ambulatory Visit (INDEPENDENT_AMBULATORY_CARE_PROVIDER_SITE_OTHER): Payer: 59 | Admitting: Family Medicine

## 2020-01-01 VITALS — BP 118/76 | HR 56 | Temp 97.9°F | Ht 73.0 in | Wt 295.6 lb

## 2020-01-01 DIAGNOSIS — E039 Hypothyroidism, unspecified: Secondary | ICD-10-CM | POA: Diagnosis not present

## 2020-01-01 DIAGNOSIS — R358 Other polyuria: Secondary | ICD-10-CM | POA: Diagnosis not present

## 2020-01-01 DIAGNOSIS — E038 Other specified hypothyroidism: Secondary | ICD-10-CM | POA: Insufficient documentation

## 2020-01-01 DIAGNOSIS — R3589 Other polyuria: Secondary | ICD-10-CM | POA: Insufficient documentation

## 2020-01-01 LAB — POC URINALSYSI DIPSTICK (AUTOMATED)
Bilirubin, UA: NEGATIVE
Blood, UA: NEGATIVE
Glucose, UA: NEGATIVE
Ketones, UA: NEGATIVE
Leukocytes, UA: NEGATIVE
Nitrite, UA: NEGATIVE
Protein, UA: NEGATIVE
Spec Grav, UA: 1.015 (ref 1.010–1.025)
Urobilinogen, UA: 0.2 E.U./dL
pH, UA: 6 (ref 5.0–8.0)

## 2020-01-01 LAB — TSH: TSH: 9.63 u[IU]/mL — ABNORMAL HIGH (ref 0.35–4.50)

## 2020-01-01 LAB — T4, FREE: Free T4: 0.77 ng/dL (ref 0.60–1.60)

## 2020-01-01 NOTE — Assessment & Plan Note (Signed)
Check UA.  Normal sugar levels when last checked.  No significant LUTS.

## 2020-01-01 NOTE — Progress Notes (Signed)
This visit was conducted in person.  BP 118/76 (BP Location: Left Arm, Patient Position: Sitting, Cuff Size: Normal)   Pulse (!) 56   Temp 97.9 F (36.6 C) (Temporal)   Ht 6\' 1"  (1.854 m)   Wt 295 lb 9 oz (134.1 kg)   SpO2 97%   BMI 38.99 kg/m    CC: f/u thyroid Subjective:    Patient ID: Victor Zimmerman, male    DOB: 10/28/1985, 34 y.o.   MRN: [id number]  HPI: Victor Zimmerman is a 34 y.o. male presenting on 01/01/2020 for Follow-up (Here for 6-8 wk thyroid f/u.  Recently stared levothyroxine. )   See prior note for details: Incidentally found hypothyroidism at physical over summer.  Lab Results  Component Value Date   TSH 15.52 (H) 10/27/2019  Free T4 0.73 (0.6-1.6) Free T3 3.6 (2.3-4.2)  We started levothyroxine 12/28/2019 daily - taking appropriately first thing in the morning with glass of water, waits 1 hour then drinks coffee then breakfast in another hour.   Unsure if fmhx thyroid disease.  Feeling well on this medication - hasn't noticed much change.  He has changed diet - eating healthier.  Activity limited by knees.  No heat or cold intolerance, diarrhea/constipation, skin or hair changes, fatigue.   Notes increasing polyuria in evenings (up to every 15 minutes). No urgency, dysuria, or hematuria. No nocturia, hesitancy dribbling or incomplete emptying. These symptoms have been ongoing for years.  Caffeine - 3 cups coffee daily, no sweet tea or sodas. Not over drinking water. Limits spicy foods.  Brother had prostate symptoms - treated with abx.   ADHD - adderall XR caused myalgia/arthralgia. Metadate CD better tolerated but caused joint pains at higher doses and became ineffective at lowe doses. Previously tried wellbutrin and strattera. Currently off stimulant.      Relevant past medical, surgical, family and social history reviewed and updated as indicated. Interim medical history since our last visit reviewed. Allergies and medications reviewed and  updated. Outpatient Medications Prior to Visit  Medication Sig Dispense Refill  . acetaminophen (TYLENOL) 500 MG tablet Take 1,000 mg by mouth every 6 (six) hours as needed for pain.    aspirin-acetaminophen-caffeine (EXCEDRIN MIGRAINE) 250-250-65 MG tablet Take 2 tablets by mouth every 6 (six) hours as needed for headache.    Marland Kitchen azelastine (ASTELIN) 0.1 % nasal spray USE ONE (1) SPRAY IN EACH NOSTRIL TWO TIMES A DAY 30 mL 6  . fluticasone (FLONASE) 50 MCG/ACT nasal spray USE ONE SPRAY IN EACH NOSTRIL DAILY 16 mL 3  . levothyroxine (SYNTHROID) 50 MCG tablet Take 1 tablet (50 mcg total) by mouth daily. 30 tablet 6  . meloxicam (MOBIC) 15 MG tablet Take 15 mg by mouth daily.    Marland Kitchen omeprazole (PRILOSEC) 40 MG capsule Take 1 capsule (40 mg total) by mouth daily. 90 capsule 3   No facility-administered medications prior to visit.     Per HPI unless specifically indicated in ROS section below Review of Systems Objective:  BP 118/76 (BP Location: Left Arm, Patient Position: Sitting, Cuff Size: Normal)   Pulse (!) 56   Temp 97.9 F (36.6 C) (Temporal)   Ht 6\' 1"  (1.854 m)   Wt 295 lb 9 oz (134.1 kg)   SpO2 97%   BMI 38.99 kg/m   Wt Readings from Last 3 Encounters:  01/01/20 295 lb 9 oz (134.1 kg)  10/27/19 (!) 300 lb 6 oz (136.2 kg)  04/08/18 294 lb 8 oz (133.6 kg)  Physical Exam Vitals and nursing note reviewed.  Constitutional:      Appearance: Normal appearance. He is obese. He is not ill-appearing.  Neck:     Thyroid: No thyroid mass, thyromegaly or thyroid tenderness.  Cardiovascular:     Rate and Rhythm: Normal rate and regular rhythm.     Pulses: Normal pulses.     Heart sounds: Normal heart sounds. No murmur heard.   Pulmonary:     Effort: Pulmonary effort is normal. No respiratory distress.     Breath sounds: Normal breath sounds. No wheezing, rhonchi or rales.  Musculoskeletal:     Cervical back: Normal range of motion. No rigidity.  Lymphadenopathy:      Cervical: No cervical adenopathy.  Neurological:     Mental Status: He is alert.       Results for orders placed or performed in visit on 10/28/19  T3, free  Result Value Ref Range   T3, Free 3.6 2.3 - 4.2 pg/mL  T4, free  Result Value Ref Range   Free T4 0.73 0.60 - 1.60 ng/dL   Assessment & Plan:  This visit occurred during the SARS-CoV-2 public health emergency.  Safety protocols were in place, including screening questions prior to the visit, additional usage of staff PPE, and extensive cleaning of exam room while observing appropriate contact time as indicated for disinfecting solutions.   Problem List Items Addressed This Visit    Polyuria    Check UA.  Normal sugar levels when last checked.  No significant LUTS.       Acquired hypothyroidism - Primary    Has been taking thyroid replacement appropriately, without significant change in status. Will update TFTs today. Low threshold to increase dose if needed.       Relevant Orders   TSH   T3   T4, free   POCT Urinalysis Dipstick (Automated)       No orders of the defined types were placed in this encounter.  Orders Placed This Encounter  Procedures  . TSH  . T3  . T4, free  . POCT Urinalysis Dipstick (Automated)    Patient Instructions  Urinalysis today  Recheck thyroid levels today - we will be in touch with results.  Try to limit caffeine a bit more.  Double check thyroid bottle for # of pills and date of fill.   Follow up plan: Return if symptoms worsen or fail to improve.  [name], MD

## 2020-01-01 NOTE — Assessment & Plan Note (Signed)
Has been taking thyroid replacement appropriately, without significant change in status. Will update TFTs today. Low threshold to increase dose if needed.

## 2020-01-01 NOTE — Patient Instructions (Addendum)
Urinalysis today  Recheck thyroid levels today - we will be in touch with results.  Try to limit caffeine a bit more.  Double check thyroid bottle for # of pills and date of fill.

## 2020-01-02 ENCOUNTER — Encounter: Payer: Self-pay | Admitting: Family Medicine

## 2020-01-02 ENCOUNTER — Other Ambulatory Visit: Payer: Self-pay | Admitting: Family Medicine

## 2020-01-02 MED ORDER — LEVOTHYROXINE SODIUM 100 MCG PO TABS: 100.0000 ug | ORAL_TABLET | Freq: Every day | ORAL | 3 refills | Status: DC

## 2020-01-07 LAB — SARS COV-2 SEROLOGY(COVID-19)AB(IGG,IGM),IMMUNOASSAY
SARS CoV-2 AB IgG: NEGATIVE
SARS CoV-2 IgM: NEGATIVE

## 2020-01-07 LAB — T3: T3, Total: 135 ng/dL (ref 76–181)

## 2020-01-20 ENCOUNTER — Other Ambulatory Visit: Payer: Self-pay | Admitting: Family Medicine

## 2020-02-19 ENCOUNTER — Other Ambulatory Visit: Payer: Self-pay

## 2020-02-19 ENCOUNTER — Ambulatory Visit (INDEPENDENT_AMBULATORY_CARE_PROVIDER_SITE_OTHER): Payer: 59 | Admitting: Family Medicine

## 2020-02-19 ENCOUNTER — Encounter: Payer: Self-pay | Admitting: Family Medicine

## 2020-02-19 VITALS — BP 118/74 | HR 55 | Temp 97.7°F | Ht 73.0 in | Wt 295.4 lb

## 2020-02-19 DIAGNOSIS — E039 Hypothyroidism, unspecified: Secondary | ICD-10-CM

## 2020-02-19 LAB — T4, FREE: Free T4: 0.89 ng/dL (ref 0.60–1.60)

## 2020-02-19 LAB — TSH: TSH: 3.31 u[IU]/mL (ref 0.35–4.50)

## 2020-02-19 NOTE — Progress Notes (Signed)
This visit was conducted in person.  BP 118/74 (BP Location: Left Arm, Patient Position: Sitting, Cuff Size: Large)   Pulse (!) 55   Temp 97.7 F (36.5 C) (Temporal)   Ht 6\' 1"  (1.854 m)   Wt 295 lb 7 oz (134 kg)   SpO2 97%   BMI 38.98 kg/m   Pulse Readings from Last 3 Encounters:  02/19/20 (!) 55  01/01/20 (!) 56  10/27/19 (!) 57    CC: hypothyroidism f/u Subjective:    Patient ID: 12/28/19, male    DOB: 12/17/1985, 34 y.o.   MRN: [id number]  HPI: Ramaj Frangos is a 34 y.o. male presenting on 02/19/2020 for Follow-up (Here for med f/u.  Wants to discuss adjusting levothyroxine. )   Hypothyroidism - we started levothyroxine over the summer, currently on 02-17-1976 dose. Administering correctly - am with glass of water, 1 hour between coffee then afterwards breakfast. Hasn't noticed any difference on current dose.   No high thyroid symptoms of diarrhea, palpitations, heat intolerance or weight loss.  Ongoing chronic constipation.   Lab Results  Component Value Date   TSH 9.63 (H) 01/01/2020   T3TOTAL 135 01/01/2020    ADHD - currently off stimulant. adderall XR caused myalgia/arthralgia. Metadate CD better tolerated but caused joint pains at higher doses and became ineffective at lowe doses. Previously tried wellbutrin and strattera.  No known fmhx thyroid disease.  Mother passed away last year (58yo) - ?covid related sepsis- health problems ?Rx drug abuse.  No relationship with father (drug use).      Relevant past medical, surgical, family and social history reviewed and updated as indicated. Interim medical history since our last visit reviewed. Allergies and medications reviewed and updated. Outpatient Medications Prior to Visit  Medication Sig Dispense Refill  . acetaminophen (TYLENOL) 500 MG tablet Take 1,000 mg by mouth every 6 (six) hours as needed for pain.    03/02/2020 aspirin-acetaminophen-caffeine (EXCEDRIN MIGRAINE) 250-250-65 MG tablet Take 2 tablets by mouth  every 6 (six) hours as needed for headache.    Marland Kitchen azelastine (ASTELIN) 0.1 % nasal spray USE ONE (1) SPRAY IN EACH NOSTRIL TWO TIMES A DAY 30 mL 6  . fluticasone (FLONASE) 50 MCG/ACT nasal spray USE ONE SPRAY IN EACH NOSTRIL DAILY 48 mL 3  . levothyroxine (SYNTHROID) 100 MCG tablet Take 1 tablet (100 mcg total) by mouth daily before breakfast. 30 tablet 3  . meloxicam (MOBIC) 15 MG tablet Take 15 mg by mouth daily.    Marland Kitchen omeprazole (PRILOSEC) 40 MG capsule Take 1 capsule (40 mg total) by mouth daily. 90 capsule 3   No facility-administered medications prior to visit.     Per HPI unless specifically indicated in ROS section below Review of Systems Objective:  BP 118/74 (BP Location: Left Arm, Patient Position: Sitting, Cuff Size: Large)   Pulse (!) 55   Temp 97.7 F (36.5 C) (Temporal)   Ht 6\' 1"  (1.854 m)   Wt 295 lb 7 oz (134 kg)   SpO2 97%   BMI 38.98 kg/m   Wt Readings from Last 3 Encounters:  02/19/20 295 lb 7 oz (134 kg)  01/01/20 295 lb 9 oz (134.1 kg)  10/27/19 (!) 300 lb 6 oz (136.2 kg)      Physical Exam Vitals and nursing note reviewed.  Constitutional:      Appearance: Normal appearance. He is obese. He is not ill-appearing.  Neck:     Thyroid: No thyroid mass, thyromegaly or thyroid tenderness.  Cardiovascular:     Rate and Rhythm: Regular rhythm. Bradycardia present.     Pulses: Normal pulses.     Heart sounds: Normal heart sounds. No murmur heard.   Pulmonary:     Effort: Pulmonary effort is normal. No respiratory distress.     Breath sounds: Normal breath sounds. No wheezing, rhonchi or rales.  Neurological:     Mental Status: He is alert.  Psychiatric:        Mood and Affect: Mood normal.        Behavior: Behavior normal.       Results for orders placed or performed in visit on 01/01/20  SARS CoV2 Serology(COVID19) AB(IgG,IgM),Immunoassay  Result Value Ref Range   SARS CoV-2 AB IgG NEGATIVE NEGATIVE   SARS CoV-2 IgM NEGATIVE NEGATIVE  TSH  Result  Value Ref Range   TSH 9.63 (H) 0.35 - 4.50 uIU/mL  T3  Result Value Ref Range   T3, Total 135 76 - 181 ng/dL  T4, free  Result Value Ref Range   Free T4 0.77 0.60 - 1.60 ng/dL  POCT Urinalysis Dipstick (Automated)  Result Value Ref Range   Color, UA yellow    Clarity, UA clear    Glucose, UA Negative Negative   Bilirubin, UA negative    Ketones, UA negative    Spec Grav, UA 1.015 1.010 - 1.025   Blood, UA negative    pH, UA 6.0 5.0 - 8.0   Protein, UA Negative Negative   Urobilinogen, UA 0.2 0.2 or 1.0 E.U./dL   Nitrite, UA negative    Leukocytes, UA Negative Negative   Assessment & Plan:  This visit occurred during the SARS-CoV-2 public health emergency.  Safety protocols were in place, including screening questions prior to the visit, additional usage of staff PPE, and extensive cleaning of exam room while observing appropriate contact time as indicated for disinfecting solutions.   Problem List Items Addressed This Visit    Acquired hypothyroidism - Primary    No significant difference despite increasing dose to last visit. Will check TFTs and reassess control, discussed likely TSH goal range 0.35-2.5.       Relevant Orders   TSH   T4, free       No orders of the defined types were placed in this encounter.  Orders Placed This Encounter  Procedures  . TSH  . T4, free    Patient Instructions  Update labs today. We will likely increase thyroid dose.    Follow up plan: Return if symptoms worsen or fail to improve.  [name], MD

## 2020-02-19 NOTE — Assessment & Plan Note (Signed)
No significant difference despite increasing dose to last visit. Will check TFTs and reassess control, discussed likely TSH goal range 0.35-2.5.

## 2020-02-19 NOTE — Patient Instructions (Addendum)
Update labs today. We will likely increase thyroid dose.

## 2020-02-23 ENCOUNTER — Other Ambulatory Visit: Payer: Self-pay | Admitting: Family Medicine

## 2020-02-23 MED ORDER — LEVOTHYROXINE SODIUM 112 MCG PO TABS: 112.0000 ug | ORAL_TABLET | Freq: Every day | ORAL | 6 refills | Status: DC

## 2020-03-22 ENCOUNTER — Ambulatory Visit: Payer: 59 | Admitting: Family Medicine

## 2020-03-29 ENCOUNTER — Other Ambulatory Visit: Payer: Self-pay | Admitting: Family Medicine

## 2020-04-13 ENCOUNTER — Ambulatory Visit (INDEPENDENT_AMBULATORY_CARE_PROVIDER_SITE_OTHER): Payer: 59 | Admitting: Family Medicine

## 2020-04-13 ENCOUNTER — Encounter: Payer: Self-pay | Admitting: Family Medicine

## 2020-04-13 ENCOUNTER — Other Ambulatory Visit: Payer: Self-pay

## 2020-04-13 ENCOUNTER — Other Ambulatory Visit: Payer: Self-pay | Admitting: Family Medicine

## 2020-04-13 VITALS — BP 108/70 | HR 74 | Temp 97.6°F | Ht 73.0 in | Wt 294.1 lb

## 2020-04-13 DIAGNOSIS — E039 Hypothyroidism, unspecified: Secondary | ICD-10-CM

## 2020-04-13 DIAGNOSIS — E669 Obesity, unspecified: Secondary | ICD-10-CM

## 2020-04-13 LAB — TSH: TSH: 3.9 u[IU]/mL (ref 0.35–4.50)

## 2020-04-13 LAB — T4, FREE: Free T4: 0.88 ng/dL (ref 0.60–1.60)

## 2020-04-13 MED ORDER — LEVOTHYROXINE SODIUM 125 MCG PO TABS: 125.0000 ug | ORAL_TABLET | Freq: Every day | ORAL | 3 refills | Status: DC

## 2020-04-13 NOTE — Progress Notes (Signed)
Patient ID: Victor Zimmerman, male    DOB: 1985-12-16, 34 y.o.   MRN: [id number]  This visit was conducted in person.  BP 108/70 (BP Location: Left Arm, Patient Position: Sitting, Cuff Size: Large)   Pulse 74   Temp 97.6 F (36.4 C) (Temporal)   Ht 6\' 1"  (1.854 m)   Wt 294 lb 2 oz (133.4 kg)   SpO2 96%   BMI 38.81 kg/m   BP Readings from Last 3 Encounters:  04/13/20 108/70  02/19/20 118/74  01/01/20 118/76   Pulse Readings from Last 3 Encounters:  04/13/20 74  02/19/20 (!) 55  01/01/20 (!) 56   CC: 1 mo f/u visit  Subjective:   HPI: Victor Zimmerman is a 34 y.o. male presenting on 04/13/2020 for Follow-up (Here for 1 mo f/u.)   See prior notes for details.   Hypothyroidism - currently on 04/15/2020 levothyroxine. Hasn't noticed significant change/benefit despite medication. No hyper or hypothyroid symptoms. He would be interested in increasing dose if able.   Obesity - maintaining diet - oatmeal in the mornings, white rice with boiled eggs for lunch, supper snacking on dry cereal. No regular exercise routine.   ADHD - currently remains off stimulant. Adderall XR caused myalgia/arthralgia. Metadate CD tolerated but at higher effective doses also caused arthralgias. Previously tried on wellbutrin and strattera.   He started Active Mind with 1000mg  Cognizin, theaninie and 50mg  caffeine.      Relevant past medical, surgical, family and social history reviewed and updated as indicated. Interim medical history since our last visit reviewed. Allergies and medications reviewed and updated. Outpatient Medications Prior to Visit  Medication Sig Dispense Refill  . acetaminophen (TYLENOL) 500 MG tablet Take 1,000 mg by mouth every 6 (six) hours as needed for pain.    aspirin-acetaminophen-caffeine (EXCEDRIN MIGRAINE) 250-250-65 MG tablet Take 2 tablets by mouth every 6 (six) hours as needed for headache.    azelastine (ASTELIN) 0.1 % nasal spray USE ONE (1) SPRAY IN EACH NOSTRIL TWO  TIMES A DAY 30 mL 6  . fluticasone (FLONASE) 50 MCG/ACT nasal spray USE ONE SPRAY IN EACH NOSTRIL DAILY 48 mL 3  . levothyroxine (SYNTHROID) 112 MCG tablet Take 1 tablet (112 mcg total) by mouth daily before breakfast. 30 tablet 6  . meloxicam (MOBIC) 15 MG tablet Take 15 mg by mouth daily.    omeprazole (PRILOSEC) 40 MG capsule Take 1 capsule (40 mg total) by mouth daily. 90 capsule 3   No facility-administered medications prior to visit.     Per HPI unless specifically indicated in ROS section below Review of Systems Objective:  BP 108/70 (BP Location: Left Arm, Patient Position: Sitting, Cuff Size: Large)   Pulse 74   Temp 97.6 F (36.4 C) (Temporal)   Ht 6\' 1"  (1.854 m)   Wt 294 lb 2 oz (133.4 kg)   SpO2 96%   BMI 38.81 kg/m   Wt Readings from Last 3 Encounters:  04/13/20 294 lb 2 oz (133.4 kg)  02/19/20 295 lb 7 oz (134 kg)  01/01/20 295 lb 9 oz (134.1 kg)      Physical Exam Vitals and nursing note reviewed.  Constitutional:      Appearance: Normal appearance. He is obese. He is not ill-appearing.  Neck:     Thyroid: No thyroid mass or thyromegaly.  Cardiovascular:     Rate and Rhythm: Normal rate and regular rhythm.     Pulses: Normal pulses.  Heart sounds: Normal heart sounds. No murmur heard.   Pulmonary:     Effort: Pulmonary effort is normal. No respiratory distress.     Breath sounds: Normal breath sounds. No wheezing, rhonchi or rales.  Neurological:     Mental Status: He is alert.  Psychiatric:        Mood and Affect: Mood normal.        Behavior: Behavior normal.       Results for orders placed or performed in visit on 02/19/20  TSH  Result Value Ref Range   TSH 3.31 0.35 - 4.50 uIU/mL  T4, free  Result Value Ref Range   Free T4 0.89 0.60 - 1.60 ng/dL   Assessment & Plan:  This visit occurred during the SARS-CoV-2 public health emergency.  Safety protocols were in place, including screening questions prior to the visit, additional usage of  staff PPE, and extensive cleaning of exam room while observing appropriate contact time as indicated for disinfecting solutions.   Problem List Items Addressed This Visit    Obesity (BMI 35.0-39.9 without comorbidity)    Reviewed diet, discussed healthy options. Encourage incorporating regular exercise into routine.       Acquired hypothyroidism - Primary    No significant noted change since starting medication.  Update TFTs today, discussed dosing and optimal TSH range for his age.       Relevant Orders   TSH   T4, free       No orders of the defined types were placed in this encounter.  Orders Placed This Encounter  Procedures  . TSH  . T4, free    Patient Instructions  Lets check labwork today We will be in touch with results and plan from there.    Follow up plan: Return if symptoms worsen or fail to improve.  [name], MD

## 2020-04-13 NOTE — Assessment & Plan Note (Signed)
No significant noted change since starting medication.  Update TFTs today, discussed dosing and optimal TSH range for his age.

## 2020-04-13 NOTE — Patient Instructions (Signed)
Lets check labwork today We will be in touch with results and plan from there.

## 2020-04-13 NOTE — Assessment & Plan Note (Signed)
Reviewed diet, discussed healthy options. Encourage incorporating regular exercise into routine.

## 2020-06-24 ENCOUNTER — Other Ambulatory Visit: Payer: Self-pay | Admitting: Family Medicine

## 2020-06-24 NOTE — Telephone Encounter (Signed)
Pharmacy requesting 90-day supply.  Ok to send?

## 2020-06-25 NOTE — Telephone Encounter (Signed)
ERx 

## 2020-07-06 ENCOUNTER — Other Ambulatory Visit: Payer: Self-pay | Admitting: Family Medicine

## 2020-07-06 NOTE — Telephone Encounter (Signed)
Strength increased to 125 mcg daily and pt told to return in 6 wks for lab visit to rechk levels (see Lab Result Notes, 04/13/20).  Nothing scheduled.  Spoke with pt relaying info above.  Says he will need to call back to schedule lab visit.    E-scribed 30-day supply.  Pt aware.

## 2020-07-14 ENCOUNTER — Telehealth: Payer: Self-pay | Admitting: *Deleted

## 2020-07-14 NOTE — Telephone Encounter (Signed)
Pt has an appt with PCP on 07/15/20 that he scheduled via mychart. Looking at last labs from Dec and pt's notes on appt, this appt should have been just for labs to recheck thyroid not office visit with PCP.  Called pt to review this with pt and no answer and pt's VM box is full so no message left.   Will route to PCP and CMA so they are aware.

## 2020-07-15 ENCOUNTER — Ambulatory Visit: Payer: 59 | Admitting: Family Medicine

## 2020-07-15 ENCOUNTER — Other Ambulatory Visit: Payer: Self-pay

## 2020-07-15 ENCOUNTER — Other Ambulatory Visit (INDEPENDENT_AMBULATORY_CARE_PROVIDER_SITE_OTHER): Payer: 59

## 2020-07-15 ENCOUNTER — Encounter: Payer: Self-pay | Admitting: Family Medicine

## 2020-07-15 DIAGNOSIS — E039 Hypothyroidism, unspecified: Secondary | ICD-10-CM | POA: Diagnosis not present

## 2020-07-15 LAB — T4, FREE: Free T4: 0.86 ng/dL (ref 0.60–1.60)

## 2020-07-15 LAB — TSH: TSH: 6.33 u[IU]/mL — ABNORMAL HIGH (ref 0.35–4.50)

## 2020-07-15 NOTE — Telephone Encounter (Signed)
Spoke with pt.  He agrees to lab only vist.  C/x OV and added pt to lab schedule.    Pt asked to inform Dr. Reece Agar that he agrees to any changes in thyroid med as needed.  FYI to Dr. Reece Agar.

## 2020-07-15 NOTE — Telephone Encounter (Signed)
Ok to do lab visit only - plz try to reach pt to see if he agrees and if so may cancel OV appt.

## 2020-07-16 ENCOUNTER — Other Ambulatory Visit: Payer: Self-pay | Admitting: Family Medicine

## 2020-07-16 MED ORDER — LEVOTHYROXINE SODIUM 175 MCG PO TABS: 175.0000 ug | ORAL_TABLET | Freq: Every day | ORAL | 1 refills | Status: DC

## 2020-07-31 ENCOUNTER — Other Ambulatory Visit: Payer: Self-pay | Admitting: Family Medicine

## 2020-08-08 ENCOUNTER — Telehealth: Payer: Self-pay

## 2020-08-08 NOTE — Telephone Encounter (Signed)
Victor Zimmerman at front desk brought me notification that pt scheduled my chart appt with Dr Reece Agar on 08/17/20 about questions about thyroid med, fatigue, sleeping a lot and chest tightness. Pt has not seen Dr Reece Agar since 04/13/20.I spoke with pt; pt stopped thyroid med on 08/07/20 and pt has had more energy since then. Pt has not had any more chest tightness and no CP. I spoke with DR G and he will see pt on 08/10/20 at 12:30. Dr Reece Agar said for pt to restart the thyroid med at previous dose and pt said that makes him feel apprehensive about restarting the thyroid med and pt will talk with his wife before restarting the thyroid med.UC & ED precautions given and pt voiced understanding. Pt did not say that he would not start the thyroid med but he would have to think about it and talk with his wife also. Sending note to Dr Reece Agar and Misty Stanley CMA.

## 2020-08-09 ENCOUNTER — Other Ambulatory Visit: Payer: Self-pay | Admitting: Family Medicine

## 2020-08-09 NOTE — Telephone Encounter (Signed)
Will see tomorrow

## 2020-08-10 ENCOUNTER — Ambulatory Visit (INDEPENDENT_AMBULATORY_CARE_PROVIDER_SITE_OTHER): Payer: 59 | Admitting: Family Medicine

## 2020-08-10 ENCOUNTER — Encounter: Payer: Self-pay | Admitting: Family Medicine

## 2020-08-10 ENCOUNTER — Other Ambulatory Visit: Payer: Self-pay

## 2020-08-10 VITALS — BP 120/70 | HR 70 | Temp 97.7°F | Ht 73.0 in | Wt 297.4 lb

## 2020-08-10 DIAGNOSIS — E039 Hypothyroidism, unspecified: Secondary | ICD-10-CM | POA: Diagnosis not present

## 2020-08-10 DIAGNOSIS — R0789 Other chest pain: Secondary | ICD-10-CM | POA: Diagnosis not present

## 2020-08-10 MED ORDER — LEVOTHYROXINE SODIUM 125 MCG PO TABS: 125.0000 ug | ORAL_TABLET | Freq: Every day | ORAL | 1 refills | Status: DC

## 2020-08-10 NOTE — Patient Instructions (Addendum)
Restart lower levothyroxine dose daily - stop if any new symptoms develop.  We will recheck thyroid levels at your physical.

## 2020-08-10 NOTE — Assessment & Plan Note (Addendum)
Will restarted levothyroxine lower dose which was previously well tolerated, recheck TFTs at CPE this coming summer. Update if any recurrent symptoms.  May consider brand Synthroid.

## 2020-08-10 NOTE — Assessment & Plan Note (Addendum)
Anticipate levothyroxine related from over treatment.  He has since stopped medication and symptoms have resolved.  No known fmhx premature CAD. Consider updated EKG next visit.

## 2020-08-10 NOTE — Progress Notes (Signed)
Patient ID: Victor Zimmerman, male    DOB: 07-21-1985, 35 y.o.   MRN: [id number]  This visit was conducted in person.  BP 120/70   Pulse 70   Temp 97.7 F (36.5 C) (Temporal)   Ht 6\' 1"  (1.854 m)   Wt 297 lb 7 oz (134.9 kg)   SpO2 97%   BMI 39.24 kg/m    CC: chest tightness and fatigue  Subjective:   HPI: Victor Zimmerman is a 35 y.o. male presenting on 08/10/2020 for Chest Pain (C/o chest tightness.  Started about 1.5 wks ago.  Also, c/o fatigue.  Stopped levothyroxine, 08/06/20, thinking that was issue.  Has not had any chest discomfort since and fatigue has improved. )   See prior notes for details.  Hypothyroidism found incidentally at last physical (TSH 15, fT3 0.37, fT3 3.6) without significant hypothyroid symptoms. Started on levothyroxine 08/08/20 with slow then more rapid titration up to daily most recently 07/15/2020 (dose based on weight). With this higher dose started noticing pinching tightness to left chest of increasing frequency. Started noticing this 07/30/2020. Symptoms improved once he stopped medicine 08/07/2020.   Never heat/cold intolerance, palpitations, headache, bowel changes or skin changes. Some hair thinning over the past year.  He has taken medication consistently and correctly.   No known fmhx thyroid disease or CAD      Relevant past medical, surgical, family and social history reviewed and updated as indicated. Interim medical history since our last visit reviewed. Allergies and medications reviewed and updated. Outpatient Medications Prior to Visit  Medication Sig Dispense Refill  . acetaminophen (TYLENOL) 500 MG tablet Take 1,000 mg by mouth every 6 (six) hours as needed for pain.    08/09/2020 aspirin-acetaminophen-caffeine (EXCEDRIN MIGRAINE) 250-250-65 MG tablet Take 2 tablets by mouth every 6 (six) hours as needed for headache.    Marland Kitchen azelastine (ASTELIN) 0.1 % nasal spray USE ONE (1) SPRAY IN EACH NOSTRIL TWO TIMES A DAY 90 mL 1  . fluticasone (FLONASE)  50 MCG/ACT nasal spray USE ONE SPRAY IN EACH NOSTRIL DAILY 48 mL 3  . meloxicam (MOBIC) 15 MG tablet Take 15 mg by mouth daily.    Marland Kitchen omeprazole (PRILOSEC) 40 MG capsule Take 1 capsule (40 mg total) by mouth daily. 90 capsule 3  . levothyroxine (SYNTHROID) 175 MCG tablet Take 1 tablet (175 mcg total) by mouth daily before breakfast. (Patient not taking: Reported on 08/10/2020) 30 tablet 1   No facility-administered medications prior to visit.     Per HPI unless specifically indicated in ROS section below Review of Systems Objective:  BP 120/70   Pulse 70   Temp 97.7 F (36.5 C) (Temporal)   Ht 6\' 1"  (1.854 m)   Wt 297 lb 7 oz (134.9 kg)   SpO2 97%   BMI 39.24 kg/m   Wt Readings from Last 3 Encounters:  08/10/20 297 lb 7 oz (134.9 kg)  04/13/20 294 lb 2 oz (133.4 kg)  02/19/20 295 lb 7 oz (134 kg)      Physical Exam Vitals and nursing note reviewed.  Constitutional:      Appearance: Normal appearance. He is not ill-appearing.  Neck:     Thyroid: No thyroid mass or thyromegaly.  Cardiovascular:     Rate and Rhythm: Normal rate and regular rhythm.     Pulses: Normal pulses.     Heart sounds: Normal heart sounds. No murmur heard.   Pulmonary:     Effort: Pulmonary effort is normal.  No respiratory distress.     Breath sounds: Normal breath sounds. No wheezing, rhonchi or rales.  Musculoskeletal:     Right lower leg: No edema.     Left lower leg: No edema.  Skin:    General: Skin is warm and dry.     Findings: No rash.  Neurological:     Mental Status: He is alert.  Psychiatric:        Mood and Affect: Mood normal.        Behavior: Behavior normal.       Results for orders placed or performed in visit on 07/15/20  T4, free  Result Value Ref Range   Free T4 0.86 0.60 - 1.60 ng/dL  TSH  Result Value Ref Range   TSH 6.33 (H) 0.35 - 4.50 uIU/mL   Assessment & Plan:  This visit occurred during the SARS-CoV-2 public health emergency.  Safety protocols were in place,  including screening questions prior to the visit, additional usage of staff PPE, and extensive cleaning of exam room while observing appropriate contact time as indicated for disinfecting solutions.   Problem List Items Addressed This Visit    Acquired hypothyroidism - Primary    Will restarted levothyroxine lower dose which was previously well tolerated, recheck TFTs at CPE this coming summer. Update if any recurrent symptoms.  May consider brand Synthroid.      Relevant Medications   levothyroxine (SYNTHROID) 125 MCG tablet   Chest discomfort    Anticipate levothyroxine related from over treatment.  He has since stopped medication and symptoms have resolved.  No known fmhx premature CAD. Consider updated EKG next visit.           Meds ordered this encounter  Medications  . levothyroxine (SYNTHROID) 125 MCG tablet    Sig: Take 1 tablet (125 mcg total) by mouth daily.    Dispense:  90 tablet    Refill:  1    Use this sig   No orders of the defined types were placed in this encounter.   Patient Instructions  Restart lower levothyroxine dose daily - stop if any new symptoms develop.  We will recheck thyroid levels at your physical.   Follow up plan: No follow-ups on file.  [name], MD

## 2020-08-17 ENCOUNTER — Ambulatory Visit: Payer: 59 | Admitting: Family Medicine

## 2020-08-17 ENCOUNTER — Encounter: Payer: Self-pay | Admitting: Family Medicine

## 2020-08-17 DIAGNOSIS — E039 Hypothyroidism, unspecified: Secondary | ICD-10-CM

## 2020-10-20 ENCOUNTER — Other Ambulatory Visit: Payer: Self-pay | Admitting: Family Medicine

## 2020-10-20 DIAGNOSIS — E785 Hyperlipidemia, unspecified: Secondary | ICD-10-CM

## 2020-10-20 DIAGNOSIS — E039 Hypothyroidism, unspecified: Secondary | ICD-10-CM

## 2020-10-21 ENCOUNTER — Other Ambulatory Visit (INDEPENDENT_AMBULATORY_CARE_PROVIDER_SITE_OTHER): Payer: 59

## 2020-10-21 ENCOUNTER — Other Ambulatory Visit: Payer: Self-pay

## 2020-10-21 DIAGNOSIS — E039 Hypothyroidism, unspecified: Secondary | ICD-10-CM

## 2020-10-21 DIAGNOSIS — E785 Hyperlipidemia, unspecified: Secondary | ICD-10-CM | POA: Diagnosis not present

## 2020-10-21 LAB — COMPREHENSIVE METABOLIC PANEL
ALT: 22 U/L (ref 0–53)
AST: 18 U/L (ref 0–37)
Albumin: 4.2 g/dL (ref 3.5–5.2)
Alkaline Phosphatase: 41 U/L (ref 39–117)
BUN: 11 mg/dL (ref 6–23)
CO2: 30 mEq/L (ref 19–32)
Calcium: 9.3 mg/dL (ref 8.4–10.5)
Chloride: 105 mEq/L (ref 96–112)
Creatinine, Ser: 1.09 mg/dL (ref 0.40–1.50)
GFR: 87.9 mL/min (ref >60.00)
Glucose, Bld: 92 mg/dL (ref 70–99)
Potassium: 4.2 mEq/L (ref 3.5–5.1)
Sodium: 140 mEq/L (ref 135–145)
Total Bilirubin: 0.5 mg/dL (ref 0.2–1.2)
Total Protein: 6.6 g/dL (ref 6.0–8.3)

## 2020-10-21 LAB — TSH: TSH: 6.7 u[IU]/mL — ABNORMAL HIGH (ref 0.35–5.50)

## 2020-10-21 LAB — LDL CHOLESTEROL, DIRECT: Direct LDL: 104 mg/dL

## 2020-10-21 LAB — T4, FREE: Free T4: 0.9 ng/dL (ref 0.60–1.60)

## 2020-10-21 LAB — LIPID PANEL
Cholesterol: 168 mg/dL (ref 0–200)
HDL: 34.4 mg/dL — ABNORMAL LOW (ref >39.00)
NonHDL: 133.53
Total CHOL/HDL Ratio: 5
Triglycerides: 213 mg/dL — ABNORMAL HIGH (ref 0.0–149.0)
VLDL: 42.6 mg/dL — ABNORMAL HIGH (ref 0.0–40.0)

## 2020-10-22 LAB — T3: T3, Total: 93 ng/dL (ref 76–181)

## 2020-10-27 NOTE — Progress Notes (Signed)
Patient ID: Victor Zimmerman, male    DOB: [birth year], 35 y.o.   MRN: [id number]  This visit was conducted in person.  BP 112/66   Pulse 74   Temp (!) 97.5 F (36.4 C) (Temporal)   Ht 6' 1.25" (1.861 m)   Wt 294 lb 7 oz (133.6 kg)   SpO2 96%   BMI 38.58 kg/m    CC: CPE Subjective:   HPI: Victor Zimmerman is a 35 y.o. male presenting on 10/28/2020 for Annual Exam   ADHD - currently off stimulant. Takes vitamin supplements for this (mind and [employer] multivitamin as well as ActiveMind supplement). Feels these are helpful.   Hypothyroidism - upcoming endo appt scheduled for next month.   Notes increased urinary frequency at night time, but without nocturia. No trouble during the day. No dysuria, urethral discharge, hematuria. Pressure sensation to penis to void. Ongoing issue for years. Limits caffeine, does drink fluids significantly throughout the day including 2 16 oz bottles of water every night.   Preventative: Flu shot - declines, bad reaction in the past COVID vaccine - declines for now Tdap 2015 Seat belt use discussed Sunscreen use discussed. No changing moles on skin Non smoker Alcohol - none Dentist yearly  Eye exam Q2 yrs   Caffeine: 3-5 cups/day Lives with wife, 5 children Occupation: Arts administrator, started own business Edu: HS Activity: enjoys bicycling,  Diet: good water, vegetables daily - planning to start keto diet.      Relevant past medical, surgical, family and social history reviewed and updated as indicated. Interim medical history since our last visit reviewed. Allergies and medications reviewed and updated. Outpatient Medications Prior to Visit  Medication Sig Dispense Refill   acetaminophen (TYLENOL) 500 MG tablet Take 1,000 mg by mouth every 6 (six) hours as needed for pain.     aspirin-acetaminophen-caffeine (EXCEDRIN MIGRAINE) 250-250-65 MG tablet Take 2 tablets by mouth every 6 (six) hours as needed for headache.     azelastine (ASTELIN)  0.1 % nasal spray USE ONE (1) SPRAY IN EACH NOSTRIL TWO TIMES A DAY 90 mL 1   fluticasone (FLONASE) 50 MCG/ACT nasal spray USE ONE SPRAY IN EACH NOSTRIL DAILY 48 mL 3   levothyroxine (SYNTHROID) 125 MCG tablet Take 1 tablet (125 mcg total) by mouth daily. 90 tablet 1   meloxicam (MOBIC) 15 MG tablet Take 15 mg by mouth daily.     omeprazole (PRILOSEC) 40 MG capsule Take 1 capsule (40 mg total) by mouth daily. 90 capsule 3   No facility-administered medications prior to visit.     Per HPI unless specifically indicated in ROS section below Review of Systems  Constitutional:  Negative for activity change, appetite change, chills, fatigue, fever and unexpected weight change.  HENT:  Negative for hearing loss.   Eyes:  Negative for visual disturbance.  Respiratory:  Negative for cough, chest tightness, shortness of breath and wheezing.   Cardiovascular:  Negative for chest pain, palpitations and leg swelling.  Gastrointestinal:  Negative for abdominal distention, abdominal pain, blood in stool, constipation, diarrhea, nausea and vomiting.  Endocrine: Negative for cold intolerance and heat intolerance.  Genitourinary:  Negative for difficulty urinating and hematuria.  Musculoskeletal:  Negative for arthralgias, myalgias and neck pain.  Skin:  Negative for rash.  Neurological:  Positive for dizziness (occasional). Negative for seizures, syncope and headaches.  Hematological:  Negative for adenopathy. Does not bruise/bleed easily.  Psychiatric/Behavioral:  Negative for dysphoric mood. The patient is not nervous/anxious.  Objective:  BP 112/66   Pulse 74   Temp (!) 97.5 F (36.4 C) (Temporal)   Ht 6' 1.25" (1.861 m)   Wt 294 lb 7 oz (133.6 kg)   SpO2 96%   BMI 38.58 kg/m   Wt Readings from Last 3 Encounters:  10/28/20 294 lb 7 oz (133.6 kg)  08/10/20 297 lb 7 oz (134.9 kg)  04/13/20 294 lb 2 oz (133.4 kg)      Physical Exam Vitals and nursing note reviewed.  Constitutional:       General: He is not in acute distress.    Appearance: Normal appearance. He is well-developed. He is not ill-appearing.  HENT:     Head: Normocephalic and atraumatic.     Right Ear: Hearing, tympanic membrane, ear canal and external ear normal.     Left Ear: Hearing, tympanic membrane, ear canal and external ear normal.  Eyes:     General: No scleral icterus.    Extraocular Movements: Extraocular movements intact.     Conjunctiva/sclera: Conjunctivae normal.     Pupils: Pupils are equal, round, and reactive to light.  Neck:     Thyroid: No thyroid mass or thyromegaly.  Cardiovascular:     Rate and Rhythm: Normal rate and regular rhythm.     Pulses: Normal pulses.          Radial pulses are 2+ on the right side and 2+ on the left side.     Heart sounds: Normal heart sounds. No murmur heard. Pulmonary:     Effort: Pulmonary effort is normal. No respiratory distress.     Breath sounds: Normal breath sounds. No wheezing, rhonchi or rales.  Abdominal:     General: Bowel sounds are normal. There is no distension.     Palpations: Abdomen is soft. There is no mass.     Tenderness: There is no abdominal tenderness. There is no guarding or rebound.     Hernia: No hernia is present.  Musculoskeletal:        General: Normal range of motion.     Cervical back: Normal range of motion and neck supple.     Right lower leg: No edema.     Left lower leg: No edema.  Lymphadenopathy:     Cervical: No cervical adenopathy.  Skin:    General: Skin is warm and dry.     Findings: No rash.  Neurological:     General: No focal deficit present.     Mental Status: He is alert and oriented to person, place, and time.  Psychiatric:        Mood and Affect: Mood normal.        Behavior: Behavior normal.        Thought Content: Thought content normal.        Judgment: Judgment normal.      Results for orders placed or performed in visit on 10/21/20  T3  Result Value Ref Range   T3, Total 93 76 - 181  ng/dL  T4, free  Result Value Ref Range   Free T4 0.90 0.60 - 1.60 ng/dL  TSH  Result Value Ref Range   TSH 6.70 (H) 0.35 - 5.50 uIU/mL  Comprehensive metabolic panel  Result Value Ref Range   Sodium 140 135 - 145 mEq/L   Potassium 4.2 3.5 - 5.1 mEq/L   Chloride 105 96 - 112 mEq/L   CO2 30 19 - 32 mEq/L   Glucose, Bld 92 70 - 99 mg/dL  BUN 11 6 - 23 mg/dL   Creatinine, Ser 2.26 0.40 - 1.50 mg/dL   Total Bilirubin 0.5 0.2 - 1.2 mg/dL   Alkaline Phosphatase 41 39 - 117 U/L   AST 18 0 - 37 U/L   ALT 22 0 - 53 U/L   Total Protein 6.6 6.0 - 8.3 g/dL   Albumin 4.2 3.5 - 5.2 g/dL   GFR 33.35 >45.62 mL/min   Calcium 9.3 8.4 - 10.5 mg/dL  Lipid panel  Result Value Ref Range   Cholesterol 168 0 - 200 mg/dL   Triglycerides 563.8 (H) 0.0 - 149.0 mg/dL   HDL 93.73 (L) >42.87 mg/dL   VLDL 68.1 (H) 0.0 - 15.7 mg/dL   Total CHOL/HDL Ratio 5    NonHDL 133.53   LDL cholesterol, direct  Result Value Ref Range   Direct LDL 104.0 mg/dL    Assessment & Plan:  This visit occurred during the SARS-CoV-2 public health emergency.  Safety protocols were in place, including screening questions prior to the visit, additional usage of staff PPE, and extensive cleaning of exam room while observing appropriate contact time as indicated for disinfecting solutions.   Problem List Items Addressed This Visit     Obesity (BMI 35.0-39.9 without comorbidity)    Reviewed healthy diet and lifestyle changes to affect sustainable weight loss.         Health maintenance examination    Preventative protocols reviewed and updated unless pt declined. Discussed healthy diet and lifestyle.        ADHD (attention deficit hyperactivity disorder), inattentive type    Stable period off stimulant. Did not feel they were effective. Doing well only on supplements.        Seasonal allergic rhinitis    Managed well with flonase, astelin PRN.        Acquired hypothyroidism    Back on levothyroxine daily  however TSH remains elevated. Will not increase does at this time given difficulty tolerating higher doses previously, rather await endo eval next month. Denies hypothyroid symptoms besides fatigue.        Dyslipidemia    Encouraged healthy diet options to imrpove triglyceride levels.  The ASCVD Risk score Denman George DC Jr., et al., 2013) failed to calculate for the following reasons:   The 2013 ASCVD risk score is only valid for ages 70 to 18          Meds ordered this encounter  Medications   Cholecalciferol (VITAMIN D3) 25 MCG (1000 UT) CAPS    Sig: Take 1 capsule (1,000 Units total) by mouth daily.    Dispense:  30 capsule    No orders of the defined types were placed in this encounter.   Patient instructions: Continue current medicines.  Return as needed or in 1 year for next physical.  Urinalysis today   Follow up plan: Return in about 1 year (around 10/28/2021) for annual exam, prior fasting for blood work.  [name], MD

## 2020-10-28 ENCOUNTER — Other Ambulatory Visit: Payer: Self-pay

## 2020-10-28 ENCOUNTER — Encounter: Payer: Self-pay | Admitting: Family Medicine

## 2020-10-28 ENCOUNTER — Ambulatory Visit (INDEPENDENT_AMBULATORY_CARE_PROVIDER_SITE_OTHER): Payer: 59 | Admitting: Family Medicine

## 2020-10-28 VITALS — BP 112/66 | HR 74 | Temp 97.5°F | Ht 73.25 in | Wt 294.4 lb

## 2020-10-28 DIAGNOSIS — E039 Hypothyroidism, unspecified: Secondary | ICD-10-CM

## 2020-10-28 DIAGNOSIS — F900 Attention-deficit hyperactivity disorder, predominantly inattentive type: Secondary | ICD-10-CM | POA: Diagnosis not present

## 2020-10-28 DIAGNOSIS — E669 Obesity, unspecified: Secondary | ICD-10-CM

## 2020-10-28 DIAGNOSIS — J302 Other seasonal allergic rhinitis: Secondary | ICD-10-CM

## 2020-10-28 DIAGNOSIS — R3589 Other polyuria: Secondary | ICD-10-CM | POA: Diagnosis not present

## 2020-10-28 DIAGNOSIS — Z0000 Encounter for general adult medical examination without abnormal findings: Secondary | ICD-10-CM

## 2020-10-28 DIAGNOSIS — E785 Hyperlipidemia, unspecified: Secondary | ICD-10-CM

## 2020-10-28 LAB — POC URINALSYSI DIPSTICK (AUTOMATED)
Blood, UA: NEGATIVE
Glucose, UA: NEGATIVE
Ketones, UA: NEGATIVE
Leukocytes, UA: NEGATIVE
Nitrite, UA: NEGATIVE
Protein, UA: NEGATIVE
Spec Grav, UA: 1.025 (ref 1.010–1.025)
Urobilinogen, UA: 0.2 E.U./dL
pH, UA: 6 (ref 5.0–8.0)

## 2020-10-28 MED ORDER — VITAMIN D3 25 MCG (1000 UT) PO CAPS: 1.0000 | ORAL_CAPSULE | Freq: Every day | ORAL | Status: AC

## 2020-10-28 NOTE — Assessment & Plan Note (Signed)
Anticipate from over drinking at night - rec limit fluids to 1 8oz glass of water after 7pm. Check UA today. Consider trial flomax if ongoing.

## 2020-10-28 NOTE — Assessment & Plan Note (Signed)
Reviewed healthy diet and lifestyle changes to affect sustainable weight loss.  

## 2020-10-28 NOTE — Assessment & Plan Note (Signed)
Preventative protocols reviewed and updated unless pt declined. Discussed healthy diet and lifestyle.  

## 2020-10-28 NOTE — Patient Instructions (Addendum)
Continue current medicines.  Return as needed or in 1 year for next physical.  Urinalysis today   Health Maintenance, Male Adopting a healthy lifestyle and getting preventive care are important in promoting health and wellness. Ask your health care provider about: The right schedule for you to have regular tests and exams. Things you can do on your own to prevent diseases and keep yourself healthy. What should I know about diet, weight, and exercise? Eat a healthy diet  Eat a diet that includes plenty of vegetables, fruits, low-fat dairy products, and lean protein. Do not eat a lot of foods that are high in solid fats, added sugars, or sodium.  Maintain a healthy weight Body mass index (BMI) is a measurement that can be used to identify possible weight problems. It estimates body fat based on height and weight. Your health care provider can help determine your BMI and help you achieve or maintain ahealthy weight. Get regular exercise Get regular exercise. This is one of the most important things you can do for your health. Most adults should: Exercise for at least 150 minutes each week. The exercise should increase your heart rate and make you sweat (moderate-intensity exercise). Do strengthening exercises at least twice a week. This is in addition to the moderate-intensity exercise. Spend less time sitting. Even light physical activity can be beneficial. Watch cholesterol and blood lipids Have your blood tested for lipids and cholesterol at 36 years of age, then havethis test every 5 years. You may need to have your cholesterol levels checked more often if: Your lipid or cholesterol levels are high. You are older than 36 years of age. You are at high risk for heart disease. What should I know about cancer screening? Many types of cancers can be detected early and may often be prevented. Depending on your health history and family history, you may need to have cancer screening at various  ages. This may include screening for: Colorectal cancer. Prostate cancer. Skin cancer. Lung cancer. What should I know about heart disease, diabetes, and high blood pressure? Blood pressure and heart disease High blood pressure causes heart disease and increases the risk of stroke. This is more likely to develop in people who have high blood pressure readings, are of African descent, or are overweight. Talk with your health care provider about your target blood pressure readings. Have your blood pressure checked: Every 3-5 years if you are 42-14 years of age. Every year if you are 67 years old or older. If you are between the ages of 69 and 62 and are a current or former smoker, ask your health care provider if you should have a one-time screening for abdominal aortic aneurysm (AAA). Diabetes Have regular diabetes screenings. This checks your fasting blood sugar level. Have the screening done: Once every three years after age 53 if you are at a normal weight and have a low risk for diabetes. More often and at a younger age if you are overweight or have a high risk for diabetes. What should I know about preventing infection? Hepatitis B If you have a higher risk for hepatitis B, you should be screened for this virus. Talk with your health care provider to find out if you are at risk forhepatitis B infection. Hepatitis C Blood testing is recommended for: Everyone born from [birth year] through 1966. Anyone with known risk factors for hepatitis C. Sexually transmitted infections (STIs) You should be screened each year for STIs, including gonorrhea and chlamydia, if: You  are sexually active and are younger than 36 years of age. You are older than 36 years of age and your health care provider tells you that you are at risk for this type of infection. Your sexual activity has changed since you were last screened, and you are at increased risk for chlamydia or gonorrhea. Ask your health care provider if  you are at risk. Ask your health care provider about whether you are at high risk for HIV. Your health care provider may recommend a prescription medicine to help prevent HIV infection. If you choose to take medicine to prevent HIV, you should first get tested for HIV. You should then be tested every 3 months for as long as you are taking the medicine. Follow these instructions at home: Lifestyle Do not use any products that contain nicotine or tobacco, such as cigarettes, e-cigarettes, and chewing tobacco. If you need help quitting, ask your health care provider. Do not use street drugs. Do not share needles. Ask your health care provider for help if you need support or information about quitting drugs. Alcohol use Do not drink alcohol if your health care provider tells you not to drink. If you drink alcohol: Limit how much you have to 0-2 drinks a day. Be aware of how much alcohol is in your drink. In the [place] one drink equals one 12 oz bottle of beer (355 mL), one 5 oz glass of wine (148 mL), or one 1 oz glass of hard liquor (44 mL). General instructions Schedule regular health, dental, and eye exams. Stay current with your vaccines. Tell your health care provider if: You often feel depressed. You have ever been abused or do not feel safe at home. Summary Adopting a healthy lifestyle and getting preventive care are important in promoting health and wellness. Follow your health care provider's instructions about healthy diet, exercising, and getting tested or screened for diseases. Follow your health care provider's instructions on monitoring your cholesterol and blood pressure. This information is not intended to replace advice given to you by your health care provider. Make sure you discuss any questions you have with your healthcare provider. Document Revised: 04/02/2018 Document Reviewed: 04/02/2018 Elsevier Patient Education  2022 [employer].

## 2020-10-28 NOTE — Assessment & Plan Note (Addendum)
Back on levothyroxine daily however TSH remains elevated. Will not increase does at this time given difficulty tolerating higher doses previously, rather await endo eval next month. Denies hypothyroid symptoms besides fatigue.

## 2020-10-28 NOTE — Assessment & Plan Note (Addendum)
Encouraged healthy diet options to improve triglyceride levels.  The ASCVD Risk score Denman George DC Jr., et al., 2013) failed to calculate for the following reasons:   The 2013 ASCVD risk score is only valid for ages 6 to 58

## 2020-10-28 NOTE — Assessment & Plan Note (Signed)
Stable period off stimulant. Did not feel they were effective. Doing well only on supplements.

## 2020-10-28 NOTE — Assessment & Plan Note (Signed)
Managed well with flonase, astelin PRN.

## 2020-12-17 ENCOUNTER — Other Ambulatory Visit: Payer: Self-pay | Admitting: Family Medicine

## 2021-01-30 ENCOUNTER — Other Ambulatory Visit: Payer: Self-pay | Admitting: Family Medicine

## 2021-03-23 ENCOUNTER — Other Ambulatory Visit: Payer: Self-pay | Admitting: Family Medicine

## 2021-06-13 ENCOUNTER — Encounter: Payer: Self-pay | Admitting: Family Medicine

## 2021-06-13 DIAGNOSIS — E669 Obesity, unspecified: Secondary | ICD-10-CM

## 2021-06-13 DIAGNOSIS — E038 Other specified hypothyroidism: Secondary | ICD-10-CM

## 2021-06-21 MED ORDER — BUPROPION HCL ER (XL) 150 MG PO TB24: 150.0000 mg | ORAL_TABLET | Freq: Every day | ORAL | 3 refills | Status: DC

## 2021-07-14 ENCOUNTER — Other Ambulatory Visit: Payer: Self-pay | Admitting: Family Medicine

## 2021-11-05 ENCOUNTER — Other Ambulatory Visit: Payer: Self-pay | Admitting: Family Medicine

## 2021-11-06 NOTE — Telephone Encounter (Signed)
Patient has been scheduled

## 2021-11-06 NOTE — Telephone Encounter (Signed)
E-scribed refill.  Plz schedule CPE and lab visits for additional refills.  

## 2021-11-24 ENCOUNTER — Other Ambulatory Visit: Payer: Self-pay | Admitting: Family Medicine

## 2022-01-18 ENCOUNTER — Telehealth: Payer: Commercial Managed Care - HMO | Admitting: Physician Assistant

## 2022-01-18 DIAGNOSIS — J208 Acute bronchitis due to other specified organisms: Secondary | ICD-10-CM | POA: Diagnosis not present

## 2022-01-18 DIAGNOSIS — B9689 Other specified bacterial agents as the cause of diseases classified elsewhere: Secondary | ICD-10-CM | POA: Diagnosis not present

## 2022-01-18 MED ORDER — DOXYCYCLINE HYCLATE 100 MG PO TABS
100.0000 mg | ORAL_TABLET | Freq: Two times a day (BID) | ORAL | 0 refills | Status: DC
Start: 2022-01-18 — End: 2022-02-06

## 2022-01-18 NOTE — Patient Instructions (Signed)
Clarnce Flock, thank you for joining Leeanne Rio, PA-C for today's virtual visit.  While this provider is not your primary care provider (PCP), if your PCP is located in our provider database this encounter information will be shared with them immediately following your visit.  Consent: (Patient) Victor Zimmerman provided verbal consent for this virtual visit at the beginning of the encounter.  Current Medications:  Current Outpatient Medications:    acetaminophen (TYLENOL) 500 MG tablet, Take 1,000 mg by mouth every 6 (six) hours as needed for pain., Disp: , Rfl:    aspirin-acetaminophen-caffeine (EXCEDRIN MIGRAINE) 250-250-65 MG tablet, Take 2 tablets by mouth every 6 (six) hours as needed for headache., Disp: , Rfl:    Azelastine HCl 137 MCG/SPRAY SOLN, USE ONE (1) SPRAY IN EACH NOSTRIL TWO TIMES A DAY, Disp: 90 mL, Rfl: 0   buPROPion (WELLBUTRIN XL) 150 MG 24 hr tablet, TAKE 1 TABLET BY MOUTH EVERY DAY, Disp: 90 tablet, Rfl: 0   Cholecalciferol (VITAMIN D3) 25 MCG (1000 UT) CAPS, Take 1 capsule (1,000 Units total) by mouth daily., Disp: 30 capsule, Rfl:    fluticasone (FLONASE) 50 MCG/ACT nasal spray, USE ONE SPRAY IN EACH NOSTRIL DAILY, Disp: 48 mL, Rfl: 0   levothyroxine (SYNTHROID) 125 MCG tablet, TAKE 1 TABLET BY MOUTH EVERY DAY, Disp: 90 tablet, Rfl: 2   meloxicam (MOBIC) 15 MG tablet, Take 15 mg by mouth daily., Disp: , Rfl:    omeprazole (PRILOSEC) 40 MG capsule, TAKE 1 CAPSULE BY MOUTH EVERY DAY, Disp: 90 capsule, Rfl: 0   Medications ordered in this encounter:  No orders of the defined types were placed in this encounter.    *If you need refills on other medications prior to your next appointment, please contact your pharmacy*  Follow-Up: Call back or seek an in-person evaluation if the symptoms worsen or if the condition fails to improve as anticipated.  [place] [phone]  Other Instructions Take antibiotic (Doxycycline) as directed.   Increase fluids.  Get plenty of rest. Use Mucinex for congestion. Ok to continue Tylenol and Ibuprofen. Take a daily probiotic (I recommend Align or Culturelle, but even Activia Yogurt may be beneficial).  A humidifier placed in the bedroom may offer some relief for a dry, scratchy throat of nasal irritation.  Read information below on acute bronchitis. Please call or return to clinic if symptoms are not improving.  Acute Bronchitis Bronchitis is when the airways that extend from the windpipe into the lungs get red, puffy, and painful (inflamed). Bronchitis often causes thick spit (mucus) to develop. This leads to a cough. A cough is the most common symptom of bronchitis. In acute bronchitis, the condition usually begins suddenly and goes away over time (usually in 2 weeks). Smoking, allergies, and asthma can make bronchitis worse. Repeated episodes of bronchitis may cause more lung problems.  HOME CARE Rest. Drink enough fluids to keep your pee (urine) clear or pale yellow (unless you need to limit fluids as told by your doctor). Only take over-the-counter or prescription medicines as told by your doctor. Avoid smoking and secondhand smoke. These can make bronchitis worse. If you are a smoker, think about using nicotine gum or skin patches. Quitting smoking will help your lungs heal faster. Reduce the chance of getting bronchitis again by: Washing your hands often. Avoiding people with cold symptoms. Trying not to touch your hands to your mouth, nose, or eyes. Follow up with your doctor as told.  GET HELP IF: Your  symptoms do not improve after 1 week of treatment. Symptoms include: Cough. Fever. Coughing up thick spit. Body aches. Chest congestion. Chills. Shortness of breath. Sore throat.  GET HELP RIGHT AWAY IF:  You have an increased fever. You have chills. You have severe shortness of breath. You have bloody thick spit (sputum). You throw up (vomit) often. You lose too much  body fluid (dehydration). You have a severe headache. You faint.  MAKE SURE YOU:  Understand these instructions. Will watch your condition. Will get help right away if you are not doing well or get worse. Document Released: 09/26/2007 Document Revised: 12/10/2012 Document Reviewed: 09/30/2012 [facility] Patient Information 2015 [place], [place]. This information is not intended to replace advice given to you by your health care provider. Make sure you discuss any questions you have with your health care provider.    If you have been instructed to have an in-person evaluation today at a local Urgent Care facility, please use the link below. It will take you to a list of all of our available [facility], including address, phone number and hours of operation. Please do not delay care.  [facility]  If you or a family member do not have a primary care provider, use the link below to schedule a visit and establish care. When you choose a [facility] primary care physician or advanced practice provider, you gain a long-term partner in health. Find a Primary Care Provider  Learn more about [facility] in-office and virtual care options: [facility]

## 2022-01-18 NOTE — Progress Notes (Signed)
Virtual Visit Consent   Victor Zimmerman, you are scheduled for a virtual visit with a [facility] provider today. Just as with appointments in the office, your consent must be obtained to participate. Your consent will be active for this visit and any virtual visit you may have with one of our providers in the next 365 days. If you have a MyChart account, a copy of this consent can be sent to you electronically.  As this is a virtual visit, video technology does not allow for your provider to perform a traditional examination. This may limit your provider's ability to fully assess your condition. If your provider identifies any concerns that need to be evaluated in person or the need to arrange testing (such as labs, EKG, etc.), we will make arrangements to do so. Although advances in technology are sophisticated, we cannot ensure that it will always work on either your end or our end. If the connection with a video visit is poor, the visit may have to be switched to a telephone visit. With either a video or telephone visit, we are not always able to ensure that we have a secure connection.  By engaging in this virtual visit, you consent to the provision of healthcare and authorize for your insurance to be billed (if applicable) for the services provided during this visit. Depending on your insurance coverage, you may receive a charge related to this service.  I need to obtain your verbal consent now. Are you willing to proceed with your visit today? Victor Zimmerman has provided verbal consent on 01/18/2022 for a virtual visit (video or telephone). Piedad Climes, [place]  Date: 01/18/2022 6:39 PM  Virtual Visit via Video Note   I, Piedad Climes, connected with  Victor Zimmerman  ([id number], 06-02-1985) on 01/18/22 at  6:30 PM EDT by a video-enabled telemedicine application and verified that I am speaking with the correct person using two identifiers.  Location: Patient: Virtual Visit Location  Patient: Home Provider: Virtual Visit Location Provider: Home Office   I discussed the limitations of evaluation and management by telemedicine and the availability of in person appointments. The patient expressed understanding and agreed to proceed.    History of Present Illness: Victor Zimmerman is a 36 y.o. who identifies as a male who was assigned male at birth, and is being seen today for 2+ weeks of fatigue with occasional chills, now with nasal, head and chest congestion and cough that is sometimes productive of thick phlegm. Has noted fatigue. Symptoms continuing to progress. Denies chest pain and SOB. Denies GI symptoms. % kids in home have been sick off an on but no known exposures to COVID or influenza. Has been taking ibuprofen which helps him feel better, especially concerning chills.  HPI: HPI  Problems:  Patient Active Problem List   Diagnosis Date Noted   Dyslipidemia 10/20/2020   Hypothyroidism due to Hashimoto's thyroiditis 01/01/2020   Polyuria 01/01/2020   GERD (gastroesophageal reflux disease) 06/24/2015   Stressful life event affecting family 06/24/2015   PAC (premature atrial contraction) 06/10/2014   Seasonal allergic rhinitis    ADHD (attention deficit hyperactivity disorder), inattentive type 11/03/2013   Health maintenance examination 05/26/2013   Obesity (BMI 35.0-39.9 without comorbidity)     Allergies:  Allergies  Allergen Reactions   Amoxicillin Other (See Comments)    All "cillin" Blisters in mouth   Medications:  Current Outpatient Medications:    doxycycline (VIBRA-TABS) 100 MG tablet, Take 1 tablet (100 mg total) by  mouth 2 (two) times daily., Disp: 14 tablet, Rfl: 0   acetaminophen (TYLENOL) 500 MG tablet, Take 1,000 mg by mouth every 6 (six) hours as needed for pain., Disp: , Rfl:    aspirin-acetaminophen-caffeine (EXCEDRIN MIGRAINE) 250-250-65 MG tablet, Take 2 tablets by mouth every 6 (six) hours as needed for headache., Disp: , Rfl:    Azelastine  HCl 137 MCG/SPRAY SOLN, USE ONE (1) SPRAY IN EACH NOSTRIL TWO TIMES A DAY, Disp: 90 mL, Rfl: 0   buPROPion (WELLBUTRIN XL) 150 MG 24 hr tablet, TAKE 1 TABLET BY MOUTH EVERY DAY, Disp: 90 tablet, Rfl: 0   Cholecalciferol (VITAMIN D3) 25 MCG (1000 UT) CAPS, Take 1 capsule (1,000 Units total) by mouth daily., Disp: 30 capsule, Rfl:    fluticasone (FLONASE) 50 MCG/ACT nasal spray, USE ONE SPRAY IN EACH NOSTRIL DAILY, Disp: 48 mL, Rfl: 0   levothyroxine (SYNTHROID) 125 MCG tablet, TAKE 1 TABLET BY MOUTH EVERY DAY, Disp: 90 tablet, Rfl: 2   meloxicam (MOBIC) 15 MG tablet, Take 15 mg by mouth daily., Disp: , Rfl:    omeprazole (PRILOSEC) 40 MG capsule, TAKE 1 CAPSULE BY MOUTH EVERY DAY, Disp: 90 capsule, Rfl: 0  Observations/Objective: Patient is well-developed, well-nourished in no acute distress.  Resting comfortably at home.  Head is normocephalic, atraumatic.  No labored breathing. Speech is clear and coherent with logical content.  Patient is alert and oriented at baseline.  Assessment and Plan: 1. Acute bacterial bronchitis - doxycycline (VIBRA-TABS) 100 MG tablet; Take 1 tablet (100 mg total) by mouth 2 (two) times daily.  Dispense: 14 tablet; Refill: 0  Rx Doxycycline.  Increase fluids.  Rest.  Saline nasal spray.  Probiotic.  Mucinex as directed.  Humidifier in bedroom.  Call or return to clinic if symptoms are not improving.   Follow Up Instructions: I discussed the assessment and treatment plan with the patient. The patient was provided an opportunity to ask questions and all were answered. The patient agreed with the plan and demonstrated an understanding of the instructions.  A copy of instructions were sent to the patient via MyChart unless otherwise noted below.   The patient was advised to call back or seek an in-person evaluation if the symptoms worsen or if the condition fails to improve as anticipated.  Time:  I spent 10 minutes with the patient via telehealth technology  discussing the above problems/concerns.    Leeanne Rio, PA-C

## 2022-01-26 ENCOUNTER — Telehealth: Payer: Commercial Managed Care - HMO | Admitting: Physician Assistant

## 2022-01-26 ENCOUNTER — Telehealth: Payer: Commercial Managed Care - HMO | Admitting: Family Medicine

## 2022-01-26 DIAGNOSIS — J40 Bronchitis, not specified as acute or chronic: Secondary | ICD-10-CM

## 2022-01-26 DIAGNOSIS — B9689 Other specified bacterial agents as the cause of diseases classified elsewhere: Secondary | ICD-10-CM

## 2022-01-26 MED ORDER — BENZONATATE 200 MG PO CAPS: 200.0000 mg | ORAL_CAPSULE | Freq: Three times a day (TID) | ORAL | 0 refills | Status: AC | PRN

## 2022-01-26 MED ORDER — PREDNISONE 20 MG PO TABS: 40.0000 mg | ORAL_TABLET | Freq: Every day | ORAL | 0 refills | Status: AC

## 2022-01-26 NOTE — Progress Notes (Signed)
Virtual Visit Consent   Victor Zimmerman, you are scheduled for a virtual visit with a [facility] provider today. Just as with appointments in the office, your consent must be obtained to participate. Your consent will be active for this visit and any virtual visit you may have with one of our providers in the next 365 days. If you have a MyChart account, a copy of this consent can be sent to you electronically.  As this is a virtual visit, video technology does not allow for your provider to perform a traditional examination. This may limit your provider's ability to fully assess your condition. If your provider identifies any concerns that need to be evaluated in person or the need to arrange testing (such as labs, EKG, etc.), we will make arrangements to do so. Although advances in technology are sophisticated, we cannot ensure that it will always work on either your end or our end. If the connection with a video visit is poor, the visit may have to be switched to a telephone visit. With either a video or telephone visit, we are not always able to ensure that we have a secure connection.  By engaging in this virtual visit, you consent to the provision of healthcare and authorize for your insurance to be billed (if applicable) for the services provided during this visit. Depending on your insurance coverage, you may receive a charge related to this service.  I need to obtain your verbal consent now. Are you willing to proceed with your visit today? Victor Zimmerman has provided verbal consent on 01/26/2022 for a virtual visit (video or telephone). Dellia Nims, FNP  Date: 01/26/2022 1:53 PM  Virtual Visit via Video Note   I, Dellia Nims, connected with  Victor Zimmerman  ([id number], 21-Sep-1985) on 01/26/22 at  1:45 PM EDT by a video-enabled telemedicine application and verified that I am speaking with the correct person using two identifiers.  Location: Patient: Virtual Visit Location Patient:  Home Provider: Virtual Visit Location Provider: Home Office   I discussed the limitations of evaluation and management by telemedicine and the availability of in person appointments. The patient expressed understanding and agreed to proceed.    History of Present Illness: Victor Zimmerman is a 36 y.o. who identifies as a male who was assigned male at birth, and is being seen today for persistent cough for 3 weeks. He is wheezing but not sob, Covid neg. He says coughing non stop. He took doxycycline last week and it did not change anything.   HPI: HPI  Problems:  Patient Active Problem List   Diagnosis Date Noted   Dyslipidemia 10/20/2020   Hypothyroidism due to Hashimoto's thyroiditis 01/01/2020   Polyuria 01/01/2020   GERD (gastroesophageal reflux disease) 06/24/2015   Stressful life event affecting family 06/24/2015   PAC (premature atrial contraction) 06/10/2014   Seasonal allergic rhinitis    ADHD (attention deficit hyperactivity disorder), inattentive type 11/03/2013   Health maintenance examination 05/26/2013   Obesity (BMI 35.0-39.9 without comorbidity)     Allergies:  Allergies  Allergen Reactions   Amoxicillin Other (See Comments)    All "cillin" Blisters in mouth   Medications:  Current Outpatient Medications:    benzonatate (TESSALON) 200 MG capsule, Take 1 capsule (200 mg total) by mouth 3 (three) times daily as needed for up to 10 days for cough., Disp: 30 capsule, Rfl: 0   predniSONE (DELTASONE) 20 MG tablet, Take 2 tablets (40 mg total) by mouth daily with breakfast for 5 days.,  Disp: 10 tablet, Rfl: 0   acetaminophen (TYLENOL) 500 MG tablet, Take 1,000 mg by mouth every 6 (six) hours as needed for pain., Disp: , Rfl:    aspirin-acetaminophen-caffeine (EXCEDRIN MIGRAINE) 250-250-65 MG tablet, Take 2 tablets by mouth every 6 (six) hours as needed for headache., Disp: , Rfl:    Azelastine HCl 137 MCG/SPRAY SOLN, USE ONE (1) SPRAY IN EACH NOSTRIL TWO TIMES A DAY, Disp: 90  mL, Rfl: 0   buPROPion (WELLBUTRIN XL) 150 MG 24 hr tablet, TAKE 1 TABLET BY MOUTH EVERY DAY, Disp: 90 tablet, Rfl: 0   Cholecalciferol (VITAMIN D3) 25 MCG (1000 UT) CAPS, Take 1 capsule (1,000 Units total) by mouth daily., Disp: 30 capsule, Rfl:    doxycycline (VIBRA-TABS) 100 MG tablet, Take 1 tablet (100 mg total) by mouth 2 (two) times daily., Disp: 14 tablet, Rfl: 0   fluticasone (FLONASE) 50 MCG/ACT nasal spray, USE ONE SPRAY IN EACH NOSTRIL DAILY, Disp: 48 mL, Rfl: 0   levothyroxine (SYNTHROID) 125 MCG tablet, TAKE 1 TABLET BY MOUTH EVERY DAY, Disp: 90 tablet, Rfl: 2   meloxicam (MOBIC) 15 MG tablet, Take 15 mg by mouth daily., Disp: , Rfl:    omeprazole (PRILOSEC) 40 MG capsule, TAKE 1 CAPSULE BY MOUTH EVERY DAY, Disp: 90 capsule, Rfl: 0  Observations/Objective: Patient is well-developed, well-nourished in no acute distress.  Resting comfortably  at home.  Head is normocephalic, atraumatic.  No labored breathing.  Speech is clear and coherent with logical content.  Patient is alert and oriented at baseline.    Assessment and Plan: 1. Bronchitis  Increase fluids, humidifier at night, urgent care if sx persist or worsen.   Follow Up Instructions: I discussed the assessment and treatment plan with the patient. The patient was provided an opportunity to ask questions and all were answered. The patient agreed with the plan and demonstrated an understanding of the instructions.  A copy of instructions were sent to the patient via MyChart unless otherwise noted below.     The patient was advised to call back or seek an in-person evaluation if the symptoms worsen or if the condition fails to improve as anticipated.  Time:  I spent 10 minutes with the patient via telehealth technology discussing the above problems/concerns.    Georgana Curio, FNP

## 2022-01-26 NOTE — Patient Instructions (Signed)

## 2022-01-26 NOTE — Progress Notes (Signed)
Because you have failed the 1st line treatment, we do highly recommend for you to be evaluated in person for appropriate treatment. I recommend that you be seen in a face to face visit.   NOTE: There will be NO CHARGE for this eVisit   If you are having a true medical emergency please call 911.      For an urgent face to face visit, [facility] has seven urgent care centers for your convenience:     [facility] [place] [facility] [place] Get Driving Directions [phone] [address] [place], [place] [address]    [facility]) Get Driving Directions [phone] [place], [place] [address]  [facility] [place] ([place]) Get Driving Directions [phone] [address] [place],  [place]  [address]  [facility] [place] [facility] [address] Get Driving Directions  [phone] [place],  [place] [address]   [facility] Get Driving Directions [phone] [address] [place], [place], [place] [address]   [facility] Get Driving Directions  [phone] [address] [place], [place] [address]   [facility] [place] Get Driving Directions [phone] [address] [place], [place] [address]  Your MyChart E-visit questionnaire answers were reviewed by a board certified advanced clinical practitioner to complete your personal care plan based on your specific symptoms.  Thank you for using e-Visits.   I have spent 5 minutes in review of e-visit questionnaire, review and updating patient chart, medical decision making and response to patient.   Mar Daring, PA-C

## 2022-01-29 ENCOUNTER — Other Ambulatory Visit: Payer: Self-pay | Admitting: Family Medicine

## 2022-02-04 ENCOUNTER — Other Ambulatory Visit: Payer: Self-pay | Admitting: Family Medicine

## 2022-02-04 DIAGNOSIS — E785 Hyperlipidemia, unspecified: Secondary | ICD-10-CM

## 2022-02-04 DIAGNOSIS — E063 Autoimmune thyroiditis: Secondary | ICD-10-CM

## 2022-02-04 DIAGNOSIS — Z1159 Encounter for screening for other viral diseases: Secondary | ICD-10-CM

## 2022-02-06 ENCOUNTER — Ambulatory Visit
Admission: EM | Admit: 2022-02-06 | Discharge: 2022-02-06 | Disposition: A | Discharge status: Home / Self Care | Payer: Commercial Managed Care - HMO | Attending: Emergency Medicine | Admitting: Emergency Medicine

## 2022-02-06 DIAGNOSIS — J209 Acute bronchitis, unspecified: Secondary | ICD-10-CM | POA: Diagnosis not present

## 2022-02-06 MED ORDER — ALBUTEROL SULFATE HFA 108 (90 BASE) MCG/ACT IN AERS: 2.0000 | INHALATION_SPRAY | Freq: Four times a day (QID) | RESPIRATORY_TRACT | 5 refills | Status: DC | PRN

## 2022-02-06 MED ORDER — PROMETHAZINE-DM 6.25-15 MG/5ML PO SYRP: 5.0000 mL | ORAL_SOLUTION | Freq: Four times a day (QID) | ORAL | 0 refills | Status: DC | PRN

## 2022-02-06 MED ORDER — ALBUTEROL SULFATE (2.5 MG/3ML) 0.083% IN NEBU
2.5000 mg | INHALATION_SOLUTION | Freq: Once | RESPIRATORY_TRACT | Status: AC
  Administered 2022-02-06: 2.5 mg via RESPIRATORY_TRACT

## 2022-02-06 MED ORDER — FLUTICASONE PROPIONATE 50 MCG/ACT NA SUSP: 1.0000 | Freq: Every day | NASAL | 5 refills | Status: DC

## 2022-02-06 MED ORDER — GUAIFENESIN 400 MG PO TABS: ORAL_TABLET | ORAL | 0 refills | Status: DC

## 2022-02-06 MED ORDER — AEROCHAMBER PLUS FLO-VU LARGE MISC: 1.0000 | Freq: Once | 0 refills | Status: AC

## 2022-02-06 MED ORDER — AZELASTINE HCL 0.1 % NA SOLN: 1.0000 | Freq: Two times a day (BID) | NASAL | 5 refills | Status: DC

## 2022-02-06 MED ORDER — FEXOFENADINE HCL 180 MG PO TABS: 180.0000 mg | ORAL_TABLET | Freq: Every day | ORAL | 1 refills | Status: DC

## 2022-02-06 MED ORDER — METHYLPREDNISOLONE 8 MG PO TABS: ORAL_TABLET | ORAL | 0 refills | Status: AC

## 2022-02-06 NOTE — Discharge Instructions (Addendum)
Please see the list below for recommended medications, dosages and frequencies to provide relief of current symptoms:     Medrol (methylprednisolone): This is a steroid that will significantly calm your upper and lower airways, please take the daily recommended quantity of tablets daily with your breakfast meal starting tomorrow morning until the prescription is complete.      Allegra (fexofenadine): This is an excellent second-generation antihistamine that helps to reduce respiratory inflammatory response to environmental allergens.  This medication is not known to cause daytime sleepiness so it can be taken in the daytime.  If you find that it does make you sleepy, please feel free to take it at bedtime.   Flonase (fluticasone): This is a steroid nasal spray that you use once daily, 1 spray in each nare.  This medication does not work well if you decide to use it only used as you feel you need to, it works best used on a daily basis.  After 3 to 5 days of use, you will notice significant reduction of the inflammation and mucus production that is currently being caused by exposure to allergens, whether seasonal or environmental.  The most common side effect of this medication is nosebleeds.  If you experience a nosebleed, please discontinue use for 1 week, then feel free to resume.       Astelin (azelastine): This is an excellent nasal histamine spray that does not cause rebound congestion, please instill 1 spray into each nare with each use.     ProAir, Ventolin, Proventil (albuterol): This inhaled medication contains a short acting beta agonist bronchodilator.  This medication works on the smooth muscle that opens and constricts of your airways by relaxing the muscle.  The result of relaxation of the smooth muscle is increased air movement and improved work of breathing.  This is a short acting medication that can be used every 4-6 hours as needed for increased work of breathing, shortness of breath,  wheezing and excessive coughing.    Robitussin, Mucinex (guaifenesin): This is an expectorant.  This helps break up chest congestion and loosen up thick nasal drainage making phlegm and drainage more liquid and therefore easier to remove.  I recommend being 400 mg three times daily as needed.      Promethazine DM: Promethazine is both a nasal decongestant and an antinausea medication that makes most patients feel fairly sleepy.  The DM is dextromethorphan, a cough suppressant found in many over-the-counter cough medications.  Please take 5 mL before bedtime to minimize your cough which will help you sleep better.     Please follow-up within the next 5-7 days either with your primary care provider or urgent care if your symptoms do not resolve.  If you do not have a primary care provider, we will assist you in finding one.        If you find that your health insurance will not pay for allergy medications, please consider downloading the GoodRx app and using to get a better price than the "off the shelf" price.     Thank you for visiting urgent care today.  We appreciate the opportunity to participate in your care.

## 2022-02-06 NOTE — ED Provider Notes (Signed)
[facility]    CSN: [id number] Arrival date & time: 02/06/22  0831    HISTORY   Chief Complaint  Patient presents with   Cough   Nasal Congestion   HPI Victor Zimmerman is a pleasant, 36 y.o. male who presents to urgent care today. Pt c/o cough, and congestion for about 4-5 weeks.    Home interventions: motrin, tylenol, sudafed, mucinex Video visit, antibiotics, steroids, Tessalon Perles, no improvement.  History of allergies, using Astelin but not currently.  Feels chest congestion, sinus pressure.  No fever.  The history is provided by the patient.  Cough Cough characteristics:  Productive Sputum characteristics:  Yellow Severity:  Severe Onset quality:  Gradual Duration:  5 weeks Timing:  Constant Progression:  Worsening Chronicity:  Recurrent Smoker: no   Context: upper respiratory infection   Context comment:  History of respiratory allergies Relieved by:  Cough suppressants and decongestant Worsened by:  Deep breathing, activity and lying down Ineffective treatments:  None tried Associated symptoms: headaches, rhinorrhea, sinus congestion and wheezing   Associated symptoms: no chest pain, no chills, no diaphoresis, no ear fullness, no ear pain, no eye discharge, no fever, no myalgias, no rash, no shortness of breath, no sore throat and no weight loss    Past Medical History:  Diagnosis Date   Morbid obesity (HCC)    Seasonal allergic rhinitis    and dust   Strep pharyngitis 01/21/2015   Patient Active Problem List   Diagnosis Date Noted   Dyslipidemia 10/20/2020   Hypothyroidism due to Hashimoto's thyroiditis 01/01/2020   Polyuria 01/01/2020   GERD (gastroesophageal reflux disease) 06/24/2015   Stressful life event affecting family 06/24/2015   PAC (premature atrial contraction) 06/10/2014   Seasonal allergic rhinitis    ADHD (attention deficit hyperactivity disorder), inattentive type 11/03/2013   Health maintenance examination 05/26/2013    Obesity (BMI 35.0-39.9 without comorbidity)    Past Surgical History:  Procedure Laterality Date   NO PAST SURGERIES      Home Medications    Prior to Admission medications   Medication Sig Start Date End Date Taking? Authorizing Provider  albuterol (VENTOLIN HFA) 108 (90 Base) MCG/ACT inhaler Inhale 2 puffs into the lungs every 6 (six) hours as needed for wheezing or shortness of breath (Cough). 02/06/22  Yes Theadora Rama Scales, PA-C  azelastine (ASTELIN) 0.1 % nasal spray Place 1 spray into both nostrils 2 (two) times daily. Use in each nostril as directed 02/06/22  Yes Theadora Rama Scales, PA-C  fexofenadine (ALLEGRA) 180 MG tablet Take 1 tablet (180 mg total) by mouth daily. 02/06/22 08/05/22 Yes Theadora Rama Scales, PA-C  fluticasone (FLONASE) 50 MCG/ACT nasal spray Place 1 spray into both nostrils daily. 02/06/22  Yes Theadora Rama Scales, PA-C  guaifenesin (HUMIBID E) 400 MG TABS tablet Take 1 tablet 3 times daily as needed for chest congestion and cough 02/06/22  Yes Theadora Rama Scales, PA-C  methylPREDNISolone (MEDROL) 8 MG tablet Take 3 tablets (24 mg total) by mouth daily for 1 day, THEN 2 tablets (16 mg total) daily for 2 days, THEN 1 tablet (8 mg total) daily for 1 day. 02/06/22 02/10/22 Yes Theadora Rama Scales, PA-C  promethazine-dextromethorphan (PROMETHAZINE-DM) 6.25-15 MG/5ML syrup Take 5 mLs by mouth 4 (four) times daily as needed for cough. 02/06/22  Yes Theadora Rama Scales, PA-C  Spacer/Aero-Holding Chambers (AEROCHAMBER PLUS FLO-VU LARGE) MISC 1 each by Other route once for 1 dose. 02/06/22 02/06/22 Yes Theadora Rama Scales, PA-C  acetaminophen (TYLENOL) 500  MG tablet Take 1,000 mg by mouth every 6 (six) hours as needed for pain.    [provider]  aspirin-acetaminophen-caffeine (EXCEDRIN MIGRAINE) [phone] MG tablet Take 2 tablets by mouth every 6 (six) hours as needed for headache.    [provider]  buPROPion (WELLBUTRIN XL)  150 MG 24 hr tablet TAKE 1 TABLET BY MOUTH EVERY DAY 01/29/22   [name], MD  Cholecalciferol (VITAMIN D3) 25 MCG (1000 UT) CAPS Take 1 capsule (1,000 Units total) by mouth daily. 10/28/20   [name], MD  levothyroxine (SYNTHROID) 125 MCG tablet TAKE 1 TABLET BY MOUTH EVERY DAY 01/30/21   [name], MD  meloxicam (MOBIC) 15 MG tablet Take 15 mg by mouth daily. 09/14/19   [provider]  omeprazole (PRILOSEC) 40 MG capsule TAKE 1 CAPSULE BY MOUTH EVERY DAY 11/24/21   [name], MD    Family History Family History  Problem Relation Age of Onset   Diabetes Other        maternal side   Hypertension Mother    Cancer Maternal Uncle        brain   Cancer Maternal Uncle        unknown   Cancer Maternal Aunt        unsure   CAD Neg Hx    Social History Social History   Tobacco Use   Smoking status: Never   Smokeless tobacco: Never  Substance Use Topics   Alcohol use: No    Alcohol/week: 0.0 standard drinks of alcohol   Drug use: No   Allergies   Amoxicillin  Review of Systems Review of Systems  Constitutional:  Negative for chills, diaphoresis, fever and weight loss.  HENT:  Positive for rhinorrhea. Negative for ear pain and sore throat.   Eyes:  Negative for discharge.  Respiratory:  Positive for cough and wheezing. Negative for shortness of breath.   Cardiovascular:  Negative for chest pain.  Musculoskeletal:  Negative for myalgias.  Skin:  Negative for rash.  Neurological:  Positive for headaches.   Pertinent findings revealed after performing a 14 point review of systems has been noted in the history of present illness.  Physical Exam Triage Vital Signs ED Triage Vitals  Enc Vitals Group     BP 02/17/21 0827 (!) 147/82     Pulse Rate 02/17/21 0827 72     Resp 02/17/21 0827 18     Temp 02/17/21 0827 98.3 F (36.8 C)     Temp Source 02/17/21 0827 Oral     SpO2 02/17/21 0827 98 %     Weight --      Height --      Head  Circumference --      Peak Flow --      Pain Score 02/17/21 0826 5     Pain Loc --      Pain Edu? --      Excl. in GC? --   No data found.  Updated Vital Signs BP 119/73 (BP Location: Left Arm)   Pulse (!) 56   Temp 98.4 F (36.9 C) (Oral)   Resp 18   SpO2 96%   Physical Exam Vitals and nursing note reviewed.  Constitutional:      General: He is not in acute distress.    Appearance: Normal appearance. He is not ill-appearing.  HENT:     Head: Normocephalic and atraumatic.     Salivary Glands: Right salivary gland is not diffusely enlarged or tender.  Left salivary gland is not diffusely enlarged or tender.     Right Ear: Ear canal and external ear normal. No drainage. A middle ear effusion is present. There is no impacted cerumen. Tympanic membrane is bulging. Tympanic membrane is not injected or erythematous.     Left Ear: Ear canal and external ear normal. No drainage. A middle ear effusion is present. There is no impacted cerumen. Tympanic membrane is bulging. Tympanic membrane is not injected or erythematous.     Ears:     Comments: Bilateral EACs normal, both TMs bulging with clear fluid    Nose: Rhinorrhea present. No nasal deformity, septal deviation, signs of injury, nasal tenderness, mucosal edema or congestion. Rhinorrhea is clear.     Right Nostril: Occlusion present. No foreign body, epistaxis or septal hematoma.     Left Nostril: Occlusion present. No foreign body, epistaxis or septal hematoma.     Right Turbinates: Enlarged, swollen and pale.     Left Turbinates: Enlarged, swollen and pale.     Right Sinus: No maxillary sinus tenderness or frontal sinus tenderness.     Left Sinus: No maxillary sinus tenderness or frontal sinus tenderness.     Mouth/Throat:     Lips: Pink. No lesions.     Mouth: Mucous membranes are moist. No oral lesions.     Pharynx: Oropharynx is clear. Uvula midline. No posterior oropharyngeal erythema or uvula swelling.     Tonsils: No  tonsillar exudate. 0 on the right. 0 on the left.     Comments: Postnasal drip Eyes:     General: Lids are normal.        Right eye: No discharge.        Left eye: No discharge.     Extraocular Movements: Extraocular movements intact.     Conjunctiva/sclera: Conjunctivae normal.     Right eye: Right conjunctiva is not injected.     Left eye: Left conjunctiva is not injected.  Neck:     Trachea: Trachea and phonation normal.  Cardiovascular:     Rate and Rhythm: Normal rate and regular rhythm.     Pulses: Normal pulses.     Heart sounds: Normal heart sounds. No murmur heard.    No friction rub. No gallop.  Pulmonary:     Effort: Pulmonary effort is normal. No tachypnea, bradypnea, accessory muscle usage, prolonged expiration, respiratory distress or retractions.     Breath sounds: No stridor, decreased air movement or transmitted upper airway sounds. Examination of the right-middle field reveals rhonchi. Examination of the left-middle field reveals rhonchi. Examination of the right-lower field reveals wheezing. Examination of the left-lower field reveals wheezing. Wheezing and rhonchi present. No decreased breath sounds or rales.     Comments: Repeat auscultation post nebulized bronchodilators revealed improved work of breathing and significant improvement of breath sounds Chest:     Chest wall: No tenderness.  Musculoskeletal:        General: Normal range of motion.     Cervical back: Normal range of motion and neck supple. Normal range of motion.  Lymphadenopathy:     Cervical: No cervical adenopathy.  Skin:    General: Skin is warm and dry.     Findings: No erythema or rash.  Neurological:     General: No focal deficit present.     Mental Status: He is alert and oriented to person, place, and time.  Psychiatric:        Mood and Affect: Mood normal.  Behavior: Behavior normal.     Visual Acuity Right Eye Distance:   Left Eye Distance:   Bilateral Distance:    Right  Eye Near:   Left Eye Near:    Bilateral Near:     [facility] Couse / Diagnostics / Procedures:     Radiology No results found.  Procedures Procedures (including critical care time) EKG  Pending results:  Labs Reviewed - No data to display  Medications Ordered in UC: Medications  albuterol (PROVENTIL) (2.5 MG/3ML) 0.083% nebulizer solution 2.5 mg (2.5 mg Nebulization Given 02/06/22 1022)    UC Diagnoses / Final Clinical Impressions(s)   I have reviewed the triage vital signs and the nursing notes.  Pertinent labs & imaging results that were available during my care of the patient were reviewed by me and considered in my medical decision making (see chart for details).    Final diagnoses:  Acute bronchitis, unspecified organism   Patient showed significant improvement of work of breathing after nebulized albuterol treatment.  Patient provided with allergy medications and inhaler.  Patient provided with prescription for steroids as well for wheezing appreciated on exam.  Patient advised to return in 5 to 7 days if not showing any improvement or sooner if worsening despite these recommendations for chest x-ray.  ED Prescriptions     Medication Sig Dispense Auth. Provider   albuterol (VENTOLIN HFA) 108 (90 Base) MCG/ACT inhaler Inhale 2 puffs into the lungs every 6 (six) hours as needed for wheezing or shortness of breath (Cough). 18 g Theadora RamaMorgan, Zayvian Mcmurtry Scales, PA-C   Spacer/Aero-Holding Chambers (AEROCHAMBER PLUS FLO-VU LARGE) MISC 1 each by Other route once for 1 dose. 1 each Theadora RamaMorgan, Doristine Shehan Scales, PA-C   fexofenadine (ALLEGRA) 180 MG tablet Take 1 tablet (180 mg total) by mouth daily. 90 tablet Theadora RamaMorgan, Brodie Scovell Scales, PA-C   fluticasone (FLONASE) 50 MCG/ACT nasal spray Place 1 spray into both nostrils daily. 18.2 mL Theadora RamaMorgan, Kutler Vanvranken Scales, PA-C   azelastine (ASTELIN) 0.1 % nasal spray Place 1 spray into both nostrils 2 (two) times daily. Use in each nostril as directed 30 mL Theadora RamaMorgan,  Lennie Dunnigan Scales, PA-C   methylPREDNISolone (MEDROL) 8 MG tablet Take 3 tablets (24 mg total) by mouth daily for 1 day, THEN 2 tablets (16 mg total) daily for 2 days, THEN 1 tablet (8 mg total) daily for 1 day. 8 tablet Theadora RamaMorgan, Hideko Esselman Scales, PA-C   guaifenesin (HUMIBID E) 400 MG TABS tablet Take 1 tablet 3 times daily as needed for chest congestion and cough 21 tablet Theadora RamaMorgan, Timaya Bojarski Scales, PA-C   promethazine-dextromethorphan (PROMETHAZINE-DM) 6.25-15 MG/5ML syrup Take 5 mLs by mouth 4 (four) times daily as needed for cough. 118 mL Theadora RamaMorgan, Chigozie Basaldua Scales, PA-C      PDMP not reviewed this encounter.  Disposition Upon Discharge:  Condition: stable for discharge home Home: take medications as prescribed; routine discharge instructions as discussed; follow up as advised.  Patient presented with an acute illness with associated systemic symptoms and significant discomfort requiring urgent management. In my opinion, this is a condition that a prudent lay person (someone who possesses an average knowledge of health and medicine) may potentially expect to result in complications if not addressed urgently such as respiratory distress, impairment of bodily function or dysfunction of bodily organs.   Routine symptom specific, illness specific and/or disease specific instructions were discussed with the patient and/or caregiver at length.   As such, the patient has been evaluated and assessed, work-up was performed and treatment was provided in  alignment with urgent care protocols and evidence based medicine.  Patient/parent/caregiver has been advised that the patient may require follow up for further testing and treatment if the symptoms continue in spite of treatment, as clinically indicated and appropriate.  If the patient was tested for COVID-19, Influenza and/or RSV, then the patient/parent/guardian was advised to isolate at home pending the results of his/her diagnostic coronavirus test and  potentially longer if they're positive. I have also advised pt that if his/her COVID-19 test returns positive, it's recommended to self-isolate for at least 10 days after symptoms first appeared AND until fever-free for 24 hours without fever reducer AND other symptoms have improved or resolved. Discussed self-isolation recommendations as well as instructions for household member/close contacts as per the [facility] and [place] [facility], and also gave patient the COVID packet with this information.  Patient/parent/caregiver has been advised to return to the [facility] or PCP in 3-5 days if no better; to PCP or the Emergency Department if new signs and symptoms develop, or if the current signs or symptoms continue to change or worsen for further workup, evaluation and treatment as clinically indicated and appropriate  The patient will follow up with their current PCP if and as advised. If the patient does not currently have a PCP we will assist them in obtaining one.   The patient may need specialty follow up if the symptoms continue, in spite of conservative treatment and management, for further workup, evaluation, consultation and treatment as clinically indicated and appropriate.  Patient/parent/caregiver verbalized understanding and agreement of plan as discussed.  All questions were addressed during visit.  Please see discharge instructions below for further details of plan.  Discharge Instructions:   Discharge Instructions      Please see the list below for recommended medications, dosages and frequencies to provide relief of current symptoms:     Medrol (methylprednisolone): This is a steroid that will significantly calm your upper and lower airways, please take the daily recommended quantity of tablets daily with your breakfast meal starting tomorrow morning until the prescription is complete.      Allegra (fexofenadine): This is an excellent second-generation antihistamine that helps to reduce respiratory  inflammatory response to environmental allergens.  This medication is not known to cause daytime sleepiness so it can be taken in the daytime.  If you find that it does make you sleepy, please feel free to take it at bedtime.   Flonase (fluticasone): This is a steroid nasal spray that you use once daily, 1 spray in each nare.  This medication does not work well if you decide to use it only used as you feel you need to, it works best used on a daily basis.  After 3 to 5 days of use, you will notice significant reduction of the inflammation and mucus production that is currently being caused by exposure to allergens, whether seasonal or environmental.  The most common side effect of this medication is nosebleeds.  If you experience a nosebleed, please discontinue use for 1 week, then feel free to resume.       Astelin (azelastine): This is an excellent nasal histamine spray that does not cause rebound congestion, please instill 1 spray into each nare with each use.     ProAir, Ventolin, Proventil (albuterol): This inhaled medication contains a short acting beta agonist bronchodilator.  This medication works on the smooth muscle that opens and constricts of your airways by relaxing the muscle.  The result of relaxation of the smooth  muscle is increased air movement and improved work of breathing.  This is a short acting medication that can be used every 4-6 hours as needed for increased work of breathing, shortness of breath, wheezing and excessive coughing.    Robitussin, Mucinex (guaifenesin): This is an expectorant.  This helps break up chest congestion and loosen up thick nasal drainage making phlegm and drainage more liquid and therefore easier to remove.  I recommend being 400 mg three times daily as needed.      Promethazine DM: Promethazine is both a nasal decongestant and an antinausea medication that makes most patients feel fairly sleepy.  The DM is dextromethorphan, a cough suppressant found in  many over-the-counter cough medications.  Please take 5 mL before bedtime to minimize your cough which will help you sleep better.     Please follow-up within the next 5-7 days either with your primary care provider or urgent care if your symptoms do not resolve.  If you do not have a primary care provider, we will assist you in finding one.        If you find that your health insurance will not pay for allergy medications, please consider downloading the GoodRx app and using to get a better price than the "off the shelf" price.     Thank you for visiting urgent care today.  We appreciate the opportunity to participate in your care.       This office note has been dictated using Museum/gallery curator.  Unfortunately, this method of dictation can sometimes lead to typographical or grammatical errors.  I apologize for your inconvenience in advance if this occurs.  Please do not hesitate to reach out to me if clarification is needed.      Lynden Oxford Scales, PA-C 02/07/22 1755

## 2022-02-06 NOTE — ED Triage Notes (Signed)
Pt c/o cough, and congestion for about 4-5 weeks.   Home interventions: motrin, tylenol, sudafed, mucinex

## 2022-02-07 ENCOUNTER — Other Ambulatory Visit (INDEPENDENT_AMBULATORY_CARE_PROVIDER_SITE_OTHER): Payer: Commercial Managed Care - HMO

## 2022-02-07 DIAGNOSIS — E038 Other specified hypothyroidism: Secondary | ICD-10-CM

## 2022-02-07 DIAGNOSIS — E063 Autoimmune thyroiditis: Secondary | ICD-10-CM | POA: Diagnosis not present

## 2022-02-07 DIAGNOSIS — E785 Hyperlipidemia, unspecified: Secondary | ICD-10-CM | POA: Diagnosis not present

## 2022-02-07 DIAGNOSIS — Z1159 Encounter for screening for other viral diseases: Secondary | ICD-10-CM | POA: Diagnosis not present

## 2022-02-07 LAB — COMPREHENSIVE METABOLIC PANEL
ALT: 26 U/L (ref 0–53)
AST: 20 U/L (ref 0–37)
Albumin: 4.4 g/dL (ref 3.5–5.2)
Alkaline Phosphatase: 39 U/L (ref 39–117)
BUN: 15 mg/dL (ref 6–23)
CO2: 31 mEq/L (ref 19–32)
Calcium: 9.4 mg/dL (ref 8.4–10.5)
Chloride: 104 mEq/L (ref 96–112)
Creatinine, Ser: 0.99 mg/dL (ref 0.40–1.50)
GFR: 97.76 mL/min (ref >60.00)
Glucose, Bld: 92 mg/dL (ref 70–99)
Potassium: 4.6 mEq/L (ref 3.5–5.1)
Sodium: 139 mEq/L (ref 135–145)
Total Bilirubin: 0.3 mg/dL (ref 0.2–1.2)
Total Protein: 6.8 g/dL (ref 6.0–8.3)

## 2022-02-07 LAB — LIPID PANEL
Cholesterol: 187 mg/dL (ref 0–200)
HDL: 40.8 mg/dL (ref >39.00)
LDL Cholesterol: 119 mg/dL — ABNORMAL HIGH (ref 0–99)
NonHDL: 145.87
Total CHOL/HDL Ratio: 5
Triglycerides: 135 mg/dL (ref 0.0–149.0)
VLDL: 27 mg/dL (ref 0.0–40.0)

## 2022-02-07 LAB — TSH: TSH: 1.16 u[IU]/mL (ref 0.35–5.50)

## 2022-02-08 LAB — HEPATITIS C ANTIBODY: Hepatitis C Ab: NONREACTIVE

## 2022-02-13 ENCOUNTER — Ambulatory Visit: Payer: Commercial Managed Care - HMO | Admitting: Family Medicine

## 2022-02-13 ENCOUNTER — Ambulatory Visit (INDEPENDENT_AMBULATORY_CARE_PROVIDER_SITE_OTHER): Payer: Commercial Managed Care - HMO

## 2022-02-13 ENCOUNTER — Ambulatory Visit
Admission: RE | Admit: 2022-02-13 | Discharge: 2022-02-13 | Disposition: A | Discharge status: Home / Self Care | Payer: Commercial Managed Care - HMO | Source: Ambulatory Visit | Attending: Urgent Care | Admitting: Urgent Care

## 2022-02-13 VITALS — BP 115/78 | HR 56 | Temp 97.8°F | Resp 16

## 2022-02-13 DIAGNOSIS — R059 Cough, unspecified: Secondary | ICD-10-CM

## 2022-02-13 DIAGNOSIS — J189 Pneumonia, unspecified organism: Secondary | ICD-10-CM

## 2022-02-13 DIAGNOSIS — J309 Allergic rhinitis, unspecified: Secondary | ICD-10-CM

## 2022-02-13 DIAGNOSIS — R053 Chronic cough: Secondary | ICD-10-CM | POA: Diagnosis not present

## 2022-02-13 MED ORDER — CEFDINIR 300 MG PO CAPS: 300.0000 mg | ORAL_CAPSULE | Freq: Two times a day (BID) | ORAL | 0 refills | Status: DC

## 2022-02-13 MED ORDER — PROMETHAZINE-DM 6.25-15 MG/5ML PO SYRP: 5.0000 mL | ORAL_SOLUTION | Freq: Three times a day (TID) | ORAL | 0 refills | Status: DC | PRN

## 2022-02-13 MED ORDER — AZITHROMYCIN 250 MG PO TABS: ORAL_TABLET | ORAL | 0 refills | Status: DC

## 2022-02-13 MED ORDER — PREDNISONE 50 MG PO TABS: 50.0000 mg | ORAL_TABLET | Freq: Every day | ORAL | 0 refills | Status: DC

## 2022-02-13 NOTE — ED Provider Notes (Signed)
[facility]  Note:  This document was prepared using Conservation officer, historic buildings and may include unintentional dictation errors.  MRN: [id number] DOB: [birth year]  Subjective:   Imanuel Pruiett is a 36 y.o. male presenting for recheck on 1 month history of persistent coughing, sinus congestion, sinus fullness, bilateral ear discomfort and fullness.  Patient was last seen in our clinic on 02/06/2022.  Underwent a nebulizer treatment in clinic, was given a course of methylprednisolone as an outpatient.  He is also prescribed multiple supportive medications.  Patient also underwent a course of doxycycline from 01/18/2022 video visit for acute bacterial bronchitis.  He did have a separate visit after 01/18/2022 but I am unable to track what medications were used then.  Patient has significant adverse effects with amoxicillin, reports that he gets blisters of the mouth and wants to avoid it is much as possible.  No current facility-administered medications for this encounter.  Current Outpatient Medications:    acetaminophen (TYLENOL) 500 MG tablet, Take 1,000 mg by mouth every 6 (six) hours as needed for pain., Disp: , Rfl:    albuterol (VENTOLIN HFA) 108 (90 Base) MCG/ACT inhaler, Inhale 2 puffs into the lungs every 6 (six) hours as needed for wheezing or shortness of breath (Cough)., Disp: 18 g, Rfl: 5   aspirin-acetaminophen-caffeine (EXCEDRIN MIGRAINE) 250-250-65 MG tablet, Take 2 tablets by mouth every 6 (six) hours as needed for headache., Disp: , Rfl:    azelastine (ASTELIN) 0.1 % nasal spray, Place 1 spray into both nostrils 2 (two) times daily. Use in each nostril as directed, Disp: 30 mL, Rfl: 5   buPROPion (WELLBUTRIN XL) 150 MG 24 hr tablet, TAKE 1 TABLET BY MOUTH EVERY DAY, Disp: 30 tablet, Rfl: 0   Cholecalciferol (VITAMIN D3) 25 MCG (1000 UT) CAPS, Take 1 capsule (1,000 Units total) by mouth daily., Disp: 30 capsule, Rfl:    fexofenadine (ALLEGRA) 180 MG  tablet, Take 1 tablet (180 mg total) by mouth daily., Disp: 90 tablet, Rfl: 1   fluticasone (FLONASE) 50 MCG/ACT nasal spray, Place 1 spray into both nostrils daily., Disp: 18.2 mL, Rfl: 5   guaifenesin (HUMIBID E) 400 MG TABS tablet, Take 1 tablet 3 times daily as needed for chest congestion and cough, Disp: 21 tablet, Rfl: 0   levothyroxine (SYNTHROID) 125 MCG tablet, TAKE 1 TABLET BY MOUTH EVERY DAY, Disp: 90 tablet, Rfl: 2   meloxicam (MOBIC) 15 MG tablet, Take 15 mg by mouth daily., Disp: , Rfl:    omeprazole (PRILOSEC) 40 MG capsule, TAKE 1 CAPSULE BY MOUTH EVERY DAY, Disp: 90 capsule, Rfl: 0   promethazine-dextromethorphan (PROMETHAZINE-DM) 6.25-15 MG/5ML syrup, Take 5 mLs by mouth 4 (four) times daily as needed for cough., Disp: 118 mL, Rfl: 0   Allergies  Allergen Reactions   Amoxicillin Other (See Comments)    All "cillin" Blisters in mouth    Past Medical History:  Diagnosis Date   Morbid obesity (HCC)    Seasonal allergic rhinitis    and dust   Strep pharyngitis 01/21/2015     Past Surgical History:  Procedure Laterality Date   NO PAST SURGERIES      Family History  Problem Relation Age of Onset   Diabetes Other        maternal side   Hypertension Mother    Cancer Maternal Uncle        brain   Cancer Maternal Uncle        unknown   Cancer  Maternal Aunt        unsure   CAD Neg Hx     Social History   Tobacco Use   Smoking status: Never   Smokeless tobacco: Never  Substance Use Topics   Alcohol use: No    Alcohol/week: 0.0 standard drinks of alcohol   Drug use: No    ROS   Objective:   Vitals: BP 115/78 (BP Location: Right Arm)   Pulse (!) 56   Temp 97.8 F (36.6 C) (Oral)   Resp 16   SpO2 97%   Physical Exam Constitutional:      General: He is not in acute distress.    Appearance: Normal appearance. He is well-developed and normal weight. He is not ill-appearing, toxic-appearing or diaphoretic.  HENT:     Head: Normocephalic and  atraumatic.     Right Ear: Ear canal and external ear normal. No drainage, swelling or tenderness. No middle ear effusion. There is no impacted cerumen. Tympanic membrane is not erythematous or bulging.     Left Ear: Ear canal and external ear normal. No drainage, swelling or tenderness.  No middle ear effusion. There is no impacted cerumen. Tympanic membrane is not erythematous or bulging.     Ears:     Comments: TMs opacified bilaterally.    Nose: Congestion present. No rhinorrhea.     Mouth/Throat:     Mouth: Mucous membranes are moist.     Pharynx: Posterior oropharyngeal erythema (with associated post-nasal drainage) present. No oropharyngeal exudate.  Eyes:     General: No scleral icterus.       Right eye: No discharge.        Left eye: No discharge.     Extraocular Movements: Extraocular movements intact.     Conjunctiva/sclera: Conjunctivae normal.  Cardiovascular:     Rate and Rhythm: Normal rate and regular rhythm.     Heart sounds: Normal heart sounds. No murmur heard.    No friction rub. No gallop.  Pulmonary:     Effort: Pulmonary effort is normal. No respiratory distress.     Breath sounds: Normal breath sounds. No stridor. No wheezing, rhonchi or rales.  Musculoskeletal:     Cervical back: Normal range of motion and neck supple. No rigidity. No muscular tenderness.  Neurological:     General: No focal deficit present.     Mental Status: He is alert and oriented to person, place, and time.  Psychiatric:        Mood and Affect: Mood normal.        Behavior: Behavior normal.        Thought Content: Thought content normal.    DG Chest 2 View  Result Date: 02/13/2022 CLINICAL DATA:  cough EXAM: CHEST - 2 VIEW COMPARISON:  None Available. FINDINGS: The cardiomediastinal silhouette is within normal limits. No pleural effusion. No pneumothorax. Right midlung consolidation. No acute osseous abnormality. IMPRESSION: Right mid lung consolidation suggestive of pneumonia.  Electronically Signed   By: Albin Felling M.D.   On: 02/13/2022 10:20    Assessment and Plan :   PDMP not reviewed this encounter.  1. Pneumonia of right middle lobe due to infectious organism   2. Persistent cough   3. Allergic rhinitis, unspecified seasonality, unspecified trigger     Patient was agreeable to trying cefdinir with azithromycin.  He also requested an oral prednisone course which I was agreeable to given his significant allergic rhinitis.  Use supportive care otherwise.  Recheck in 4 weeks  for a repeat chest x-ray. Counseled patient on potential for adverse effects with medications prescribed/recommended today, ER and return-to-clinic precautions discussed, patient verbalized understanding.    Wallis Bamberg, PA-C 02/13/22 [id number]

## 2022-02-13 NOTE — ED Triage Notes (Addendum)
Patient presents to [facility] for bilateral ear fullness x 2 days ago. Continues to have cough and congestion. Treating ibuprofen or tylenol. Concerned with possible sinus infection.

## 2022-02-14 ENCOUNTER — Ambulatory Visit (INDEPENDENT_AMBULATORY_CARE_PROVIDER_SITE_OTHER): Payer: Commercial Managed Care - HMO | Admitting: Family Medicine

## 2022-02-14 ENCOUNTER — Encounter: Payer: Self-pay | Admitting: Family Medicine

## 2022-02-14 VITALS — BP 128/76 | HR 79 | Temp 97.2°F | Ht 73.0 in | Wt 280.2 lb

## 2022-02-14 DIAGNOSIS — J189 Pneumonia, unspecified organism: Secondary | ICD-10-CM

## 2022-02-14 DIAGNOSIS — E063 Autoimmune thyroiditis: Secondary | ICD-10-CM

## 2022-02-14 DIAGNOSIS — F900 Attention-deficit hyperactivity disorder, predominantly inattentive type: Secondary | ICD-10-CM | POA: Diagnosis not present

## 2022-02-14 DIAGNOSIS — E785 Hyperlipidemia, unspecified: Secondary | ICD-10-CM

## 2022-02-14 DIAGNOSIS — Z0001 Encounter for general adult medical examination with abnormal findings: Secondary | ICD-10-CM | POA: Diagnosis not present

## 2022-02-14 DIAGNOSIS — E669 Obesity, unspecified: Secondary | ICD-10-CM | POA: Diagnosis not present

## 2022-02-14 DIAGNOSIS — E038 Other specified hypothyroidism: Secondary | ICD-10-CM

## 2022-02-14 DIAGNOSIS — K219 Gastro-esophageal reflux disease without esophagitis: Secondary | ICD-10-CM | POA: Diagnosis not present

## 2022-02-14 HISTORY — DX: Pneumonia, unspecified organism: J18.9

## 2022-02-14 MED ORDER — BUPROPION HCL ER (XL) 300 MG PO TB24: 300.0000 mg | ORAL_TABLET | Freq: Every day | ORAL | 1 refills | Status: DC

## 2022-02-14 MED ORDER — OMEPRAZOLE 40 MG PO CPDR: 40.0000 mg | DELAYED_RELEASE_CAPSULE | Freq: Every day | ORAL | 1 refills | Status: DC

## 2022-02-14 NOTE — Assessment & Plan Note (Signed)
By CXR, now on cefdinir and azithromycin, along with prednisone 50mg  burst. Anticipate improve on current regimen. Discussed need for rpt CXR in 4-6 wks.

## 2022-02-14 NOTE — Assessment & Plan Note (Addendum)
Stable period only on wellbutrin (used for appetite suppression)

## 2022-02-14 NOTE — Patient Instructions (Signed)
Good to see you today Finish antibiotics and steroid.  Do recommend repeat chest xray in 4-6 weeks after treatment to ensure resolution of pneumonia.  Wellbutrin dose increased to 300mg  XL once daily.  Return as needed or in 1 year for next physical.   Health Maintenance, Male Adopting a healthy lifestyle and getting preventive care are important in promoting health and wellness. Ask your health care provider about: The right schedule for you to have regular tests and exams. Things you can do on your own to prevent diseases and keep yourself healthy. What should I know about diet, weight, and exercise? Eat a healthy diet  Eat a diet that includes plenty of vegetables, fruits, low-fat dairy products, and lean protein. Do not eat a lot of foods that are high in solid fats, added sugars, or sodium. Maintain a healthy weight Body mass index (BMI) is a measurement that can be used to identify possible weight problems. It estimates body fat based on height and weight. Your health care provider can help determine your BMI and help you achieve or maintain a healthy weight. Get regular exercise Get regular exercise. This is one of the most important things you can do for your health. Most adults should: Exercise for at least 150 minutes each week. The exercise should increase your heart rate and make you sweat (moderate-intensity exercise). Do strengthening exercises at least twice a week. This is in addition to the moderate-intensity exercise. Spend less time sitting. Even light physical activity can be beneficial. Watch cholesterol and blood lipids Have your blood tested for lipids and cholesterol at 37 years of age, then have this test every 5 years. You may need to have your cholesterol levels checked more often if: Your lipid or cholesterol levels are high. You are older than 37 years of age. You are at high risk for heart disease. What should I know about cancer screening? Many types of  cancers can be detected early and may often be prevented. Depending on your health history and family history, you may need to have cancer screening at various ages. This may include screening for: Colorectal cancer. Prostate cancer. Skin cancer. Lung cancer. What should I know about heart disease, diabetes, and high blood pressure? Blood pressure and heart disease High blood pressure causes heart disease and increases the risk of stroke. This is more likely to develop in people who have high blood pressure readings or are overweight. Talk with your health care provider about your target blood pressure readings. Have your blood pressure checked: Every 3-5 years if you are 97-29 years of age. Every year if you are 16 years old or older. If you are between the ages of 72 and 13 and are a current or former smoker, ask your health care provider if you should have a one-time screening for abdominal aortic aneurysm (AAA). Diabetes Have regular diabetes screenings. This checks your fasting blood sugar level. Have the screening done: Once every three years after age 21 if you are at a normal weight and have a low risk for diabetes. More often and at a younger age if you are overweight or have a high risk for diabetes. What should I know about preventing infection? Hepatitis B If you have a higher risk for hepatitis B, you should be screened for this virus. Talk with your health care provider to find out if you are at risk for hepatitis B infection. Hepatitis C Blood testing is recommended for: Everyone born from [birth year] through 1966.  Anyone with known risk factors for hepatitis C. Sexually transmitted infections (STIs) You should be screened each year for STIs, including gonorrhea and chlamydia, if: You are sexually active and are younger than 37 years of age. You are older than 37 years of age and your health care provider tells you that you are at risk for this type of infection. Your sexual  activity has changed since you were last screened, and you are at increased risk for chlamydia or gonorrhea. Ask your health care provider if you are at risk. Ask your health care provider about whether you are at high risk for HIV. Your health care provider may recommend a prescription medicine to help prevent HIV infection. If you choose to take medicine to prevent HIV, you should first get tested for HIV. You should then be tested every 3 months for as long as you are taking the medicine. Follow these instructions at home: Alcohol use Do not drink alcohol if your health care provider tells you not to drink. If you drink alcohol: Limit how much you have to 0-2 drinks a day. Know how much alcohol is in your drink. In the [place] one drink equals one 12 oz bottle of beer (355 mL), one 5 oz glass of wine (148 mL), or one 1 oz glass of hard liquor (44 mL). Lifestyle Do not use any products that contain nicotine or tobacco. These products include cigarettes, chewing tobacco, and vaping devices, such as e-cigarettes. If you need help quitting, ask your health care provider. Do not use street drugs. Do not share needles. Ask your health care provider for help if you need support or information about quitting drugs. General instructions Schedule regular health, dental, and eye exams. Stay current with your vaccines. Tell your health care provider if: You often feel depressed. You have ever been abused or do not feel safe at home. Summary Adopting a healthy lifestyle and getting preventive care are important in promoting health and wellness. Follow your health care provider's instructions about healthy diet, exercising, and getting tested or screened for diseases. Follow your health care provider's instructions on monitoring your cholesterol and blood pressure. This information is not intended to replace advice given to you by your health care provider. Make sure you discuss any questions you have  with your health care provider. Document Revised: 08/29/2020 Document Reviewed: 08/29/2020 Elsevier Patient Education  2023 [employer].

## 2022-02-14 NOTE — Assessment & Plan Note (Signed)
Sees endocrinology Dr Buddy Duty. Continues levothyroxine 11mcg daily.

## 2022-02-14 NOTE — Progress Notes (Signed)
Patient ID: Victor Zimmerman, male    DOB: 1985/10/24, 36 y.o.   MRN: [id number]  This visit was conducted in person.  BP 128/76   Pulse 79   Temp (!) 97.2 F (36.2 C) (Temporal)   Ht 6\' 1"  (1.854 m)   Wt 280 lb 4 oz (127.1 kg)   SpO2 95%   BMI 36.97 kg/m    CC: CPE Subjective:   HPI: Victor Zimmerman is a 36 y.o. male presenting on 02/14/2022 for Annual Exam (Wants Victor. 02/16/2022 opinion of tx plan given by [facility]. )   Seen virtual visits x3 over the past month, at [facility] x2 this past week - diagnosed with bronchitis treated with steroid, albuterol nebulizer, latest diagnosed yesterday with RML pneumonia by chest xray currently on cefdinir + azithromycin and oral prednisone.   No known h/o asthma.   ADHD - currently off stimulant. Takes vitamin supplements for this (mind and [employer] multivitamin as well as ActiveMind supplement). Feels these are helpful.   On wellbutrin XL 150mg  daily for appetite suppression. Tolerates well.   Hypothyroidism - followed by endo Victor Zimmerman diagnosed hashimoto thyroiditis now on levothyroxine daily.   Preventative: Flu shot - declines, bad reaction in the past COVID vaccine - declines for now  Tdap 2015 Seat belt use discussed Sunscreen use discussed. No changing moles on skin.  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      Relevant past medical, surgical, family and social history reviewed and updated as indicated. Interim medical history since our last visit reviewed. Allergies and medications reviewed and updated. Outpatient Medications Prior to Visit  Medication Sig Dispense Refill   acetaminophen (TYLENOL) 500 MG tablet Take 1,000 mg by mouth every 6 (six) hours as needed for pain.      albuterol (VENTOLIN HFA) 108 (90 Base) MCG/ACT inhaler Inhale 2 puffs into the lungs every 6 (six) hours as needed for wheezing or shortness of breath (Cough). 18 g 5   aspirin-acetaminophen-caffeine (EXCEDRIN MIGRAINE) 250-250-65 MG tablet Take 2 tablets by mouth every 6 (six) hours as needed for headache.     azelastine (ASTELIN) 0.1 % nasal spray Place 1 spray into both nostrils 2 (two) times daily. Use in each nostril as directed 30 mL 5   azithromycin (ZITHROMAX) 250 MG tablet Day 1: take 2 tablets. Day 2-5: Take 1 tablet daily. 6 tablet 0   cefdinir (OMNICEF) 300 MG capsule Take 1 capsule (300 mg total) by mouth 2 (two) times daily. 20 capsule 0   Cholecalciferol (VITAMIN D3) 25 MCG (1000 UT) CAPS Take 1 capsule (1,000 Units total) by mouth daily. 30 capsule    fexofenadine (ALLEGRA) 180 MG tablet Take 1 tablet (180 mg total) by mouth daily. 90 tablet 1   fluticasone (FLONASE) 50 MCG/ACT nasal spray Place 1 spray into both nostrils daily. 18.2 mL 5   guaifenesin (HUMIBID E) 400 MG TABS tablet Take 1 tablet 3 times daily as needed for chest congestion and cough 21 tablet 0   predniSONE (DELTASONE) 50 MG tablet Take 1 tablet (50 mg total) by mouth daily with breakfast. 5 tablet 0   promethazine-dextromethorphan (PROMETHAZINE-DM) 6.25-15 MG/5ML syrup Take 5 mLs by mouth 3 (three) times daily as needed for cough. 100 mL  0   buPROPion (WELLBUTRIN XL) 150 MG 24 hr tablet TAKE 1 TABLET BY MOUTH EVERY DAY 30 tablet 0   levothyroxine (SYNTHROID) 125 MCG tablet TAKE 1 TABLET BY MOUTH EVERY DAY 90 tablet 2   meloxicam (MOBIC) 15 MG tablet Take 15 mg by mouth daily.     omeprazole (PRILOSEC) 40 MG capsule TAKE 1 CAPSULE BY MOUTH EVERY DAY 90 capsule 0   levothyroxine (SYNTHROID) 150 MCG tablet Take 1 tablet (150 mcg total) by mouth daily.     No facility-administered medications prior to visit.     Per HPI unless specifically indicated in ROS section below Review of Systems  Constitutional:  Positive  for chills. Negative for activity change, appetite change, fatigue, fever and unexpected weight change.  HENT:  Negative for hearing loss.   Eyes:  Negative for visual disturbance.  Respiratory:  Positive for cough, shortness of breath and wheezing. Negative for chest tightness.   Cardiovascular:  Negative for chest pain, palpitations and leg swelling.  Gastrointestinal:  Negative for abdominal distention, abdominal pain, blood in stool, constipation, diarrhea, nausea and vomiting.  Genitourinary:  Negative for difficulty urinating and hematuria.  Musculoskeletal:  Negative for arthralgias, myalgias and neck pain.  Skin:  Negative for rash.  Neurological:  Positive for dizziness and headaches. Negative for seizures and syncope.  Hematological:  Negative for adenopathy. Does not bruise/bleed easily.  Psychiatric/Behavioral:  Negative for dysphoric mood. The patient is not nervous/anxious.     Objective:  BP 128/76   Pulse 79   Temp (!) 97.2 F (36.2 C) (Temporal)   Ht 6\' 1"  (1.854 m)   Wt 280 lb 4 oz (127.1 kg)   SpO2 95%   BMI 36.97 kg/m   Wt Readings from Last 3 Encounters:  02/14/22 280 lb 4 oz (127.1 kg)  10/28/20 294 lb 7 oz (133.6 kg)  08/10/20 297 lb 7 oz (134.9 kg)      Physical Exam Vitals and nursing note reviewed.  Constitutional:      General: He is not in acute distress.    Appearance: Normal appearance. He is well-developed. He is not ill-appearing.     Comments: Tired appearing  HENT:     Head: Normocephalic and atraumatic.     Right Ear: Hearing, tympanic membrane, ear canal and external ear normal.     Left Ear: Hearing, tympanic membrane, ear canal and external ear normal.  Eyes:     General: No scleral icterus.    Extraocular Movements: Extraocular movements intact.     Conjunctiva/sclera: Conjunctivae normal.     Pupils: Pupils are equal, round, and reactive to light.  Neck:     Thyroid: No thyroid mass or thyromegaly.  Cardiovascular:     Rate and  Rhythm: Normal rate and regular rhythm.     Pulses: Normal pulses.          Radial pulses are 2+ on the right side and 2+ on the left side.     Heart sounds: Normal heart sounds. No murmur heard. Pulmonary:     Effort: Pulmonary effort is normal. No respiratory distress.     Breath sounds: Normal breath sounds. No wheezing, rhonchi or rales.     Comments: No wheezing or obvious rales to RML Abdominal:     General: Bowel sounds are normal. There is no distension.     Palpations: Abdomen is soft. There is no mass.     Tenderness: There is no abdominal tenderness. There is no guarding  or rebound.     Hernia: No hernia is present.  Musculoskeletal:        General: Normal range of motion.     Cervical back: Normal range of motion and neck supple.     Right lower leg: No edema.     Left lower leg: No edema.  Lymphadenopathy:     Cervical: No cervical adenopathy.  Skin:    General: Skin is warm and dry.     Findings: No rash.  Neurological:     General: No focal deficit present.     Mental Status: He is alert and oriented to person, place, and time.  Psychiatric:        Mood and Affect: Mood normal.        Behavior: Behavior normal.        Thought Content: Thought content normal.        Judgment: Judgment normal.       Results for orders placed or performed in visit on 02/07/22  Hepatitis C antibody  Result Value Ref Range   Hepatitis C Ab NON-REACTIVE NON-REACTIVE  Lipid panel  Result Value Ref Range   Cholesterol 187 0 - 200 mg/dL   Triglycerides 135.0 0.0 - 149.0 mg/dL   HDL 40.80 >39.00 mg/dL   VLDL 27.0 0.0 - 40.0 mg/dL   LDL Cholesterol 119 (H) 0 - 99 mg/dL   Total CHOL/HDL Ratio 5    NonHDL 145.87   Comprehensive metabolic panel  Result Value Ref Range   Sodium 139 135 - 145 mEq/L   Potassium 4.6 3.5 - 5.1 mEq/L   Chloride 104 96 - 112 mEq/L   CO2 31 19 - 32 mEq/L   Glucose, Bld 92 70 - 99 mg/dL   BUN 15 6 - 23 mg/dL   Creatinine, Ser 0.99 0.40 - 1.50 mg/dL    Total Bilirubin 0.3 0.2 - 1.2 mg/dL   Alkaline Phosphatase 39 39 - 117 U/L   AST 20 0 - 37 U/L   ALT 26 0 - 53 U/L   Total Protein 6.8 6.0 - 8.3 g/dL   Albumin 4.4 3.5 - 5.2 g/dL   GFR 97.76 >60.00 mL/min   Calcium 9.4 8.4 - 10.5 mg/dL  TSH  Result Value Ref Range   TSH 1.16 0.35 - 5.50 uIU/mL    Assessment & Plan:   Problem List Items Addressed This Visit     Encounter for general adult medical examination with abnormal findings - Primary (Chronic)    Preventative protocols reviewed and updated unless pt declined. Discussed healthy diet and lifestyle.       Obesity (BMI 35.0-39.9 without comorbidity)    Congratulated on weight loss to date.  Increase wellbutrin to 300mg  XL daily.       ADHD (attention deficit hyperactivity disorder), inattentive type    Stable period only on wellbutrin (used for appetite suppression)      GERD (gastroesophageal reflux disease)    Continues PPI       Relevant Medications   omeprazole (PRILOSEC) 40 MG capsule   Hypothyroidism due to Hashimoto's thyroiditis    Sees endocrinology Victor Buddy Duty. Continues levothyroxine 138mcg daily.      Relevant Medications   levothyroxine (SYNTHROID) 150 MCG tablet   Dyslipidemia    Chronic, stable off medication. Reviewed diet choices to improve LDL control The ASCVD Risk score (Arnett DK, et al., 2019) failed to calculate for the following reasons:   The 2019 ASCVD risk score is only valid for  ages 62 to 11       CAP (community acquired pneumonia)    By CXR, now on cefdinir and azithromycin, along with prednisone 50mg  burst. Anticipate improve on current regimen. Discussed need for rpt CXR in 4-6 wks.         Meds ordered this encounter  Medications   buPROPion (WELLBUTRIN XL) 300 MG 24 hr tablet    Sig: Take 1 tablet (300 mg total) by mouth daily.    Dispense:  90 tablet    Refill:  1   omeprazole (PRILOSEC) 40 MG capsule    Sig: Take 1 capsule (40 mg total) by mouth daily.    Dispense:  90  capsule    Refill:  1   No orders of the defined types were placed in this encounter.   Patient instructions: Good to see you today Finish antibiotics and steroid.  Do recommend repeat chest xray in 4-6 weeks after treatment to ensure resolution of pneumonia.  Wellbutrin dose increased to 300mg  XL once daily.  Return as needed or in 1 year for next physical.   Follow up plan: Return in about 1 year (around 02/15/2023) for annual exam, prior fasting for blood work.  , MD

## 2022-02-14 NOTE — Assessment & Plan Note (Signed)
Chronic, stable off medication. Reviewed diet choices to improve LDL control The ASCVD Risk score (Arnett DK, et al., 2019) failed to calculate for the following reasons:   The 2019 ASCVD risk score is only valid for ages 40 to 60

## 2022-02-14 NOTE — Assessment & Plan Note (Addendum)
Congratulated on weight loss to date.  Increase wellbutrin to 300mg  XL daily.

## 2022-02-14 NOTE — Assessment & Plan Note (Signed)
Continues PPI.  

## 2022-02-14 NOTE — Assessment & Plan Note (Signed)
Preventative protocols reviewed and updated unless pt declined. Discussed healthy diet and lifestyle.  

## 2022-02-15 ENCOUNTER — Encounter: Payer: Self-pay | Admitting: Family Medicine

## 2022-02-24 ENCOUNTER — Other Ambulatory Visit: Payer: Self-pay | Admitting: Family Medicine

## 2022-02-26 NOTE — Telephone Encounter (Signed)
Too early.  Rx sent on 02/06/22, #18.2 mL with 5 additional refills to [facility], [place] from [place].

## 2022-03-04 ENCOUNTER — Other Ambulatory Visit: Payer: Self-pay | Admitting: Family Medicine

## 2022-06-11 ENCOUNTER — Telehealth: Payer: Self-pay

## 2022-06-11 NOTE — Telephone Encounter (Signed)
[facility] - Client TELEPHONE ADVICE RECORD [facility] Patient Name: Victor Zimmerman Gender: Male DOB: May 03, 1985 Age: 37 Y 2 M 9 D Return Phone Number: [phone] (Primary), [phone] (Secondary) Address: City/ State/ Zip: [place] [address] Client [facility] - Client Client Site [place] Provider Ria Bush - MD Contact Type Call Who Is Calling Patient / Member / Family / Caregiver Call Type Triage / Clinical Relationship To Patient Self Return Phone Number [phone] (Primary) Chief Complaint Back Pain - General Reason for Call Symptomatic / Request for [place] states he has 3 family members who have tested pos for flu. He is now c/o chills, back pain, Fever(102.2), Translation No Nurse Assessment Nurse: Claiborne Billings, RN, Kim Date/Time (Eastern Time): 06/09/2022 3:10:48 PM Confirm and document reason for call. If symptomatic, describe symptoms. ---Caller states he has 4 family members who have tested pos for flu. He has chills, back pain and fever. Current temp is 104.2, states he has been under a blanket due to chills, last took Ibuprofen one hour ago. Sxs started today. Does the patient have any new or worsening symptoms? ---Yes Will a triage be completed? ---Yes Related visit to physician within the last 2 weeks? ---No Does the PT have any chronic conditions? (i.e. diabetes, asthma, this includes High risk factors for pregnancy, etc.) ---No Is this a behavioral health or substance abuse call? ---No Guidelines Guideline Title Affirmed Question Affirmed Notes Nurse Date/Time Eilene Ghazi Time) Influenza (Flu) - Seasonal Artist Beach, RN, Kim 06/09/2022 3:13:38 PM Disp. Time Eilene Ghazi Time) Disposition Final User 06/09/2022 2:53:10 PM Attempt made - message left Suzette Battiest 06/09/2022 3:17:51 PM See PCP within 24 Hours Yes  Claiborne Billings, RN, Maudie Mercury PLEASE NOTE: All timestamps contained within this report are represented as [place] Standard Time. CONFIDENTIALTY NOTICE: This fax transmission is intended only for the addressee. It contains information that is legally privileged, confidential or otherwise protected from use or disclosure. If you are not the intended recipient, you are strictly prohibited from reviewing, disclosing, copying using or disseminating any of this information or taking any action in reliance on or regarding this information. If you have received this fax in error, please notify us immediately by telephone so that we can arrange for its return to [place]. Phone: [phone], Toll-Free: [phone], Fax: [phone] Page: 2 of 2 Call Id: [id number] Final Disposition 06/09/2022 3:17:51 PM See PCP within 24 Hours Yes Claiborne Billings, RN, Max Sane Disagree/Comply Comply Caller Understands Yes PreDisposition Did not know what to do Care Advice Given Per Guideline SEE PCP WITHIN 24 HOURS: * IF OFFICE WILL BE CLOSED: You need to be seen within the next 24 hours. A clinic or an urgent care center is often a good source of care if your doctor's office is closed or you can't get an appointment. INFLUENZA - GENERAL CARE ADVICE: * Feeling dehydrated: Drink extra liquids. If the air in your home is dry, use a humidifier. * Fever: For fever over 101 F (38.3 C), take acetaminophen every 4 to 6 hours (Adults 650 mg) OR ibuprofen every 6 to 8 hours (Adults 400 mg). * Muscle aches, headache, and other pains: Often this comes and goes with the fever. Take acetaminophen every 4 to 6 hours (Adults 650 mg) OR ibuprofen every 6 to 8 hours (Adults 400 mg). CALL BACK IF: * You become worse CARE ADVICE given per Influenza - Seasonal (Adult) guideline. Comments User: Maudie Mercury,  Claiborne Billings, RN Date/Time Eilene Ghazi Time): 06/09/2022 3:13:28 PM States he has GERD, hypothyroid and arthritis User: Suezanne Jacquet, RN Date/Time (Eastern Time): 06/09/2022 3:15:34  PM Caller states he just took his temp again and it is 101. Referrals GO TO FACILITY UNDECIDE

## 2022-06-11 NOTE — Telephone Encounter (Signed)
I spoke with Victor Zimmerman; he did e visit with med live 06/09/22; Victor Zimmerman was given tamiflu; Victor Zimmerman is feeling better today. No fever, CP,SOB or wheezing. Victor Zimmerman will cb if needed. Sending note to Dr Darnell Level and G pool as Juluis Rainier.

## 2022-06-28 ENCOUNTER — Ambulatory Visit (INDEPENDENT_AMBULATORY_CARE_PROVIDER_SITE_OTHER): Payer: 59

## 2022-06-28 ENCOUNTER — Ambulatory Visit
Admission: RE | Admit: 2022-06-28 | Discharge: 2022-06-28 | Disposition: A | Discharge status: Home / Self Care | Payer: 59 | Source: Ambulatory Visit | Attending: Internal Medicine | Admitting: Internal Medicine

## 2022-06-28 VITALS — BP 119/77 | HR 86 | Temp 98.3°F | Resp 20 | Ht 73.0 in | Wt 280.0 lb

## 2022-06-28 DIAGNOSIS — J019 Acute sinusitis, unspecified: Secondary | ICD-10-CM

## 2022-06-28 DIAGNOSIS — R053 Chronic cough: Secondary | ICD-10-CM | POA: Diagnosis not present

## 2022-06-28 DIAGNOSIS — J189 Pneumonia, unspecified organism: Secondary | ICD-10-CM | POA: Diagnosis not present

## 2022-06-28 MED ORDER — ALBUTEROL SULFATE HFA 108 (90 BASE) MCG/ACT IN AERS: 2.0000 | INHALATION_SPRAY | Freq: Four times a day (QID) | RESPIRATORY_TRACT | 5 refills | Status: DC | PRN

## 2022-06-28 MED ORDER — DOXYCYCLINE HYCLATE 100 MG PO CAPS: 100.0000 mg | ORAL_CAPSULE | Freq: Two times a day (BID) | ORAL | 0 refills | Status: DC

## 2022-06-28 MED ORDER — CEFUROXIME AXETIL 500 MG PO TABS: 500.0000 mg | ORAL_TABLET | Freq: Two times a day (BID) | ORAL | 0 refills | Status: AC

## 2022-06-28 NOTE — ED Provider Notes (Signed)
[facility]  Note:  This document was prepared using Dragon voice recognition software and may include unintentional dictation errors.  MRN: [id number] DOB: 06-18-1985 DATE: 06/28/22   Subjective:  Chief Complaint:  Chief Complaint  Patient presents with   Cough   Generalized Body Aches   Wheezing     HPI: Victor Zimmerman is a 37 y.o. male presenting for productive cough for the past 3 weeks.  Patient states he was positive for the flu approximately 3 weeks ago.  He was treated with Tamiflu at that time.  He states the Tamiflu helped with the fevers and some of the symptoms; however, he has not completely recovered.  He continues to have a productive cough and feels that he is getting slightly worse.  He has tried several over-the-counter medications with no relief.  Denies fevers, sore throat, otalgia, shortness of breath, and difficulty breathing. Endorses headache and myalgias. Presents NAD.  Prior to Admission medications   Medication Sig Start Date End Date Taking? Authorizing Provider  acetaminophen (TYLENOL) 500 MG tablet Take 1,000 mg by mouth every 6 (six) hours as needed for pain.    [provider]  albuterol (VENTOLIN HFA) 108 (90 Base) MCG/ACT inhaler Inhale 2 puffs into the lungs every 6 (six) hours as needed for wheezing or shortness of breath (Cough). 02/06/22   Lynden Oxford Scales, PA-C  aspirin-acetaminophen-caffeine (EXCEDRIN MIGRAINE) [phone] MG tablet Take 2 tablets by mouth every 6 (six) hours as needed for headache.    [provider]  azelastine (ASTELIN) 0.1 % nasal spray Place 1 spray into both nostrils 2 (two) times daily. Use in each nostril as directed 02/06/22   Lynden Oxford Scales, PA-C  Cholecalciferol (VITAMIN D3) 25 MCG (1000 UT) CAPS Take 1 capsule (1,000 Units total) by mouth daily. 10/28/20   Ria Bush, MD  fluticasone [facility]) 50 MCG/ACT nasal spray Place 1 spray into both nostrils daily. 02/06/22    Lynden Oxford Scales, PA-C  guaifenesin (HUMIBID E) 400 MG TABS tablet Take 1 tablet 3 times daily as needed for chest congestion and cough 02/06/22   Lynden Oxford Scales, PA-C     Allergies  Allergen Reactions   Amoxicillin Other (See Comments)    All "cillin" Blisters in mouth   Penicillins Swelling    History:   Past Medical History:  Diagnosis Date   Morbid obesity ([place])    Seasonal allergic rhinitis    and dust   Strep pharyngitis 01/21/2015     Past Surgical History:  Procedure Laterality Date   NO PAST SURGERIES      Family History  Problem Relation Age of Onset   Hypertension Mother    Drug abuse Father    Cancer Maternal Aunt        unsure   Cancer Maternal Uncle        brain   Cancer Maternal Uncle        unknown   Diabetes Other        maternal side   CAD Neg Hx     Social History   Tobacco Use   Smoking status: Never   Smokeless tobacco: Never  Vaping Use   Vaping Use: Never used  Substance Use Topics   Alcohol use: No    Alcohol/week: 0.0 standard drinks of alcohol   Drug use: No    Review of Systems  Constitutional:  Positive for fatigue. Negative for fever.  HENT:  Positive for congestion, rhinorrhea and sinus pressure.  Negative for ear pain and sore throat.   Respiratory:  Positive for cough. Negative for shortness of breath.   Gastrointestinal:  Positive for nausea. Negative for abdominal distention and vomiting.  Musculoskeletal:  Positive for myalgias.  Neurological:  Positive for headaches.     Objective:   Vitals: BP 119/77 (BP Location: Right Arm)   Pulse 86   Temp 98.3 F (36.8 C) (Oral)   Resp 20   Ht '6\' 1"'$  (1.854 m)   Wt 280 lb (127 kg)   SpO2 95%   BMI 36.94 kg/m   Physical Exam Constitutional:      General: He is not in acute distress.    Appearance: Normal appearance. He is well-developed. He is obese. He is not ill-appearing or toxic-appearing.  HENT:     Head: Normocephalic and atraumatic.     Right  Ear: Tympanic membrane and ear canal normal.     Left Ear: Tympanic membrane and ear canal normal.     Mouth/Throat:     Pharynx: Oropharynx is clear. Uvula midline. No posterior oropharyngeal erythema.     Tonsils: No tonsillar exudate or tonsillar abscesses.  Cardiovascular:     Rate and Rhythm: Normal rate and regular rhythm.     Heart sounds: Normal heart sounds.  Pulmonary:     Effort: Pulmonary effort is normal.     Breath sounds: Normal breath sounds.     Comments: Clear to auscultation bilaterally  Abdominal:     General: Bowel sounds are normal.     Palpations: Abdomen is soft.     Tenderness: There is no abdominal tenderness.  Skin:    General: Skin is warm and dry.  Neurological:     General: No focal deficit present.     Mental Status: He is alert.  Psychiatric:        Mood and Affect: Mood and affect normal.     Results:  Labs: No results found for this or any previous visit (from the past 24 hour(s)).  Radiology: DG Chest 2 View  Result Date: 06/28/2022 CLINICAL DATA:  Cough for 1 month EXAM: CHEST - 2 VIEW COMPARISON:  02/13/2022 FINDINGS: Cardiac shadow is within normal limits. Minimal left basilar infiltrate is seen. No bony abnormality is noted. IMPRESSION: Minimal left basilar infiltrate. Electronically Signed   By: Inez Catalina M.D.   On: 06/28/2022 19:54     UC Course/Treatments:  Procedures: Procedures   Medications Ordered in UC: Medications - No data to display   Assessment and Plan :     ICD-10-CM   1. Community acquired pneumonia of left lower lobe of lung  J18.9     2. Acute non-recurrent sinusitis, unspecified location  J01.90      Pneumonia: Afebrile, nontoxic-appearing, NAD. VSS. DDX includes but not limited to: bronchitis, pneumonia, asthma POCT not recommended at this time due to length of symptoms. Imaging was significant for LLL infiltrate. Will treat with Doxycycline and Ceftin. Per chart, patient has had Cephalosporins in the  past without any issues. Continue with nasal spray and albuterol inhaler as needed.  Strict ED precautions were given and patient verbalized understanding.  ED Discharge Orders          Ordered    doxycycline (VIBRAMYCIN) 100 MG capsule  2 times daily        06/28/22 2009    cefUROXime (CEFTIN) 500 MG tablet  2 times daily with meals        06/28/22 2009  albuterol (VENTOLIN HFA) 108 (90 Base) MCG/ACT inhaler  Every 6 hours PRN        06/28/22 2013             PDMP not reviewed this encounter.     Carmie End, PA-C 06/28/22 2013

## 2022-06-28 NOTE — ED Triage Notes (Signed)
Pt presents to Urgent Care with c/o cough (w/ wheezing), body aches, and fatigue x approx 3 weeks. Had influenza 3 weeks ago, took Tamiflu.

## 2022-06-28 NOTE — Discharge Instructions (Addendum)
A prescription was sent for Doxycyline and Ceftin. These are an antibiotic often used to treat upper respiratory symptoms. Take as directed. Continue with nasal spray and inhaler as needed. Return in 2 to 3 days if no improvement. If new or worsening symptoms develop such as shortness of breath, chest pain, difficulty breathing, go directly to the ER.

## 2022-07-03 ENCOUNTER — Telehealth (HOSPITAL_COMMUNITY): Payer: Self-pay | Admitting: Emergency Medicine

## 2022-07-03 NOTE — Telephone Encounter (Signed)
Attempted follow up call to patient to check in, LVM

## 2022-07-04 ENCOUNTER — Telehealth (HOSPITAL_COMMUNITY): Payer: Self-pay | Admitting: Emergency Medicine

## 2022-07-04 NOTE — Telephone Encounter (Signed)
Patient returned follow up call, is improving.  However, he states cough is still pretty strong.  Encouraged f/u with PCP if not improving with a few more days of medicine.  Return precautions reviewed. Patient verbalized understanding.

## 2022-11-06 ENCOUNTER — Encounter: Payer: Self-pay | Admitting: Family Medicine

## 2022-11-06 ENCOUNTER — Ambulatory Visit: Payer: Medicaid Other | Admitting: Family Medicine

## 2022-11-06 VITALS — BP 118/84 | HR 75 | Temp 97.6°F | Ht 73.0 in | Wt 292.2 lb

## 2022-11-06 DIAGNOSIS — J302 Other seasonal allergic rhinitis: Secondary | ICD-10-CM | POA: Diagnosis not present

## 2022-11-06 DIAGNOSIS — E038 Other specified hypothyroidism: Secondary | ICD-10-CM

## 2022-11-06 DIAGNOSIS — M25561 Pain in right knee: Secondary | ICD-10-CM

## 2022-11-06 DIAGNOSIS — M25562 Pain in left knee: Secondary | ICD-10-CM

## 2022-11-06 DIAGNOSIS — R3911 Hesitancy of micturition: Secondary | ICD-10-CM

## 2022-11-06 DIAGNOSIS — L649 Androgenic alopecia, unspecified: Secondary | ICD-10-CM | POA: Diagnosis not present

## 2022-11-06 DIAGNOSIS — F900 Attention-deficit hyperactivity disorder, predominantly inattentive type: Secondary | ICD-10-CM

## 2022-11-06 DIAGNOSIS — E063 Autoimmune thyroiditis: Secondary | ICD-10-CM

## 2022-11-06 DIAGNOSIS — G8929 Other chronic pain: Secondary | ICD-10-CM

## 2022-11-06 LAB — POC URINALSYSI DIPSTICK (AUTOMATED)
Bilirubin, UA: NEGATIVE
Blood, UA: NEGATIVE
Glucose, UA: NEGATIVE
Ketones, UA: NEGATIVE
Leukocytes, UA: NEGATIVE
Nitrite, UA: NEGATIVE
Protein, UA: NEGATIVE
Spec Grav, UA: 1.02 (ref 1.010–1.025)
Urobilinogen, UA: 0.2 E.U./dL
pH, UA: 6 (ref 5.0–8.0)

## 2022-11-06 MED ORDER — MULTIVITAMIN ADULT PO TABS: 1.0000 | ORAL_TABLET | Freq: Every day | ORAL | Status: AC

## 2022-11-06 MED ORDER — AZELASTINE HCL 0.1 % NA SOLN: 1.0000 | Freq: Two times a day (BID) | NASAL | 11 refills | Status: DC

## 2022-11-06 MED ORDER — FINASTERIDE 1 MG PO TABS: 1.0000 mg | ORAL_TABLET | Freq: Every day | ORAL | 3 refills | Status: DC

## 2022-11-06 NOTE — Progress Notes (Unsigned)
Ph: [phone] Fax: [phone]   Patient ID: Victor Zimmerman, male    DOB: 26-May-1985, 37 y.o.   MRN: [id number]  This visit was conducted in person.  BP 118/84   Pulse 75   Temp 97.6 F (36.4 C) (Temporal)   Ht 6\' 1"  (1.854 m)   Wt 292 lb 4 oz (132.6 kg)   SpO2 97%   BMI 38.56 kg/m    CC: med refill  Subjective:   HPI: Victor Zimmerman is a 37 y.o. male presenting on 11/06/2022 for Medical Management of Chronic Issues (Here for med refill. 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   [facility] visit for CAP 06/2022, symptoms did fully resolve with antibiotic treatment (doxycycline, cefuroxime). Prior CAP 01/2022 treated with cefdinir and azithromycin, and prednisone 50mg  burst.  Previous trial albuterol inhaler didn't help.   Requests astelin nasal spray as needed refilled. Also takes flonase PRN.   Continues meloxicam 15mg  daily for knees.   Hypothyroidism - continues Synthroid daily followed by endocrinology Dr Sharl Ma - has not seen recently.   ADHD previously on stimulant, off for over a year. Continues Mind and Memory Matrix MVI and ActiveMind supplement.   Notes hair thinning on top for the past year - asks about treatment for this. Notes this runs in family (father).  Stress - doing well. Not consistent with telogen effluvium.   Notes increased urinary pressure at night that doesn't improve after voiding. Notes hesitancy. No nocturia, frequency, urgency, dysuria, hematuria. No flank pain or nausea/vomiting, lower abd pain. No rectal pressure.  Limits caffeine, dark sodas, spicy foods.  No fmhx prostate cancer.      Relevant past medical, surgical, family and social history reviewed and updated as indicated. Interim medical history since our last visit reviewed. Allergies and medications reviewed and updated. Outpatient Medications Prior to Visit  Medication Sig Dispense Refill   acetaminophen (TYLENOL) 500 MG tablet Take 1,000 mg by mouth  every 6 (six) hours as needed for pain.     aspirin-acetaminophen-caffeine (EXCEDRIN MIGRAINE) 250-250-65 MG tablet Take 2 tablets by mouth every 6 (six) hours as needed for headache.     Cholecalciferol (VITAMIN D3) 25 MCG (1000 UT) CAPS Take 1 capsule (1,000 Units total) by mouth daily. 30 capsule    meloxicam (MOBIC) 15 MG tablet Take 1 tablet (15 mg total) by mouth daily.     SYNTHROID 175 MCG tablet Take 1 tablet (175 mcg total) by mouth daily before breakfast.     azelastine (ASTELIN) 0.1 % nasal spray Place 1 spray into both nostrils 2 (two) times daily. Use in each nostril as directed 30 mL 5   fluticasone (FLONASE) 50 MCG/ACT nasal spray Place 1 spray into both nostrils daily. 18.2 mL 5   albuterol (VENTOLIN HFA) 108 (90 Base) MCG/ACT inhaler Inhale 2 puffs into the lungs every 6 (six) hours as needed for wheezing or shortness of breath (Cough). 18 g 5   doxycycline (VIBRAMYCIN) 100 MG capsule Take 1 capsule (100 mg total) by mouth 2 (two) times daily. 20 capsule 0   guaifenesin (HUMIBID E) 400 MG TABS tablet Take 1 tablet 3 times daily as needed for chest congestion and cough 21 tablet 0   No facility-administered medications prior to visit.     Per HPI unless specifically indicated in ROS section below Review of Systems  Objective:  BP 118/84   Pulse 75   Temp 97.6 F (36.4 C) (Temporal)  Ht 6\' 1"  (1.854 m)   Wt 292 lb 4 oz (132.6 kg)   SpO2 97%   BMI 38.56 kg/m   Wt Readings from Last 3 Encounters:  11/06/22 292 lb 4 oz (132.6 kg)  06/28/22 280 lb (127 kg)  02/14/22 280 lb 4 oz (127.1 kg)      Physical Exam Vitals and nursing note reviewed.  Constitutional:      Appearance: Normal appearance. He is not ill-appearing.  HENT:     Mouth/Throat:     Mouth: Mucous membranes are moist.     Pharynx: Oropharynx is clear. No oropharyngeal exudate or posterior oropharyngeal erythema.  Eyes:     Extraocular Movements: Extraocular movements intact.     Conjunctiva/sclera:  Conjunctivae normal.     Pupils: Pupils are equal, round, and reactive to light.  Neck:     Thyroid: No thyroid mass or thyromegaly.  Cardiovascular:     Rate and Rhythm: Normal rate and regular rhythm.     Pulses: Normal pulses.     Heart sounds: Normal heart sounds. No murmur heard. Pulmonary:     Effort: Pulmonary effort is normal. No respiratory distress.     Breath sounds: Normal breath sounds. No wheezing, rhonchi or rales.  Abdominal:     General: Bowel sounds are normal. There is no distension.     Palpations: Abdomen is soft. There is no mass.     Tenderness: There is no abdominal tenderness. There is no right CVA tenderness, left CVA tenderness, guarding or rebound.     Hernia: No hernia is present.  Musculoskeletal:     Right lower leg: No edema.     Left lower leg: No edema.  Skin:    General: Skin is warm and dry.     Findings: No rash.     Comments: Bitemporal recession present  Neurological:     Mental Status: He is alert.  Psychiatric:        Mood and Affect: Mood normal.        Behavior: Behavior normal.       Results for orders placed or performed in visit on 11/06/22  POCT Urinalysis Dipstick (Automated)  Result Value Ref Range   Color, UA dark yellow    Clarity, UA clear    Glucose, UA Negative Negative   Bilirubin, UA negative    Ketones, UA negative    Spec Grav, UA 1.020 1.010 - 1.025   Blood, UA negative    pH, UA 6.0 5.0 - 8.0   Protein, UA Negative Negative   Urobilinogen, UA 0.2 0.2 or 1.0 E.U./dL   Nitrite, UA negative    Leukocytes, UA Negative Negative    Assessment & Plan:   Problem List Items Addressed This Visit     ADHD (attention deficit hyperactivity disorder), inattentive type    Currently managing with over-the-counter supplements as per HPI.      Seasonal allergic rhinitis    Flonase, astelin refilled.       Relevant Medications   azelastine (ASTELIN) 0.1 % nasal spray   Hypothyroidism due to Hashimoto's thyroiditis     This is followed by endocrinology Dr. Sharl Ma.  He continues burning and Synthroid 175 mcg daily.      Relevant Medications   SYNTHROID 175 MCG tablet   Bilateral knee pain    Discussed meloxicam use      Androgenic alopecia - Primary    Most consistent with hereditary androgenic alopecia.  Reviewed treatment options including  over-the-counter minoxidil topically and low-dose finasteride (Propecia).  Discussed possible side effects of topical treatment including scalp irritation and worsening before improvement noted.  Discussed possible side effects of finasteride including sexual dysfunction, possible association with increased high-grade prostate cancer risk. Recommend start over-the-counter minoxidil daily, finasteride 1 mg daily sent to pharmacy, trial for the next several months and update with effect.      Urinary hesitancy    Notes intermittent urinary pressure and hesitancy in the evenings.  Question beginnings of BPH.  Check urinalysis today.      Relevant Orders   POCT Urinalysis Dipstick (Automated) (Completed)     Meds ordered this encounter  Medications   azelastine (ASTELIN) 0.1 % nasal spray    Sig: Place 1 spray into both nostrils 2 (two) times daily. Use in each nostril as directed    Dispense:  30 mL    Refill:  11   Multiple Vitamin (MULTIVITAMIN ADULT) TABS    Sig: Take 1 tablet by mouth daily. Mind and Memory Matrix MVI   finasteride (PROPECIA) 1 MG tablet    Sig: Take 1 tablet (1 mg total) by mouth daily.    Dispense:  30 tablet    Refill:  3   fluticasone (FLONASE) 50 MCG/ACT nasal spray    Sig: Place 1 spray into both nostrils daily.    Dispense:  18.2 mL    Refill:  5    Orders Placed This Encounter  Procedures   POCT Urinalysis Dipstick (Automated)    Patient Instructions  Astelin refilled.  Urinalysis today  Start rogaine (minoxidil) over the counter daily to affected areas.  May try propecia 1mg  nightly - sent to pharmacy.  Let us know  if any trouble with medicine.   Follow up plan: Return if symptoms worsen or fail to improve.  [name], MD

## 2022-11-06 NOTE — Patient Instructions (Addendum)
Astelin refilled.  Urinalysis today  Start rogaine (minoxidil) over the counter daily to affected areas.  May try propecia 1mg  nightly - sent to pharmacy.  Let us know if any trouble with medicine.

## 2022-11-07 ENCOUNTER — Ambulatory Visit: Payer: Medicaid Other | Admitting: Family Medicine

## 2022-11-07 DIAGNOSIS — R3911 Hesitancy of micturition: Secondary | ICD-10-CM | POA: Insufficient documentation

## 2022-11-07 DIAGNOSIS — L649 Androgenic alopecia, unspecified: Secondary | ICD-10-CM | POA: Insufficient documentation

## 2022-11-07 DIAGNOSIS — M25561 Pain in right knee: Secondary | ICD-10-CM | POA: Insufficient documentation

## 2022-11-07 MED ORDER — FLUTICASONE PROPIONATE 50 MCG/ACT NA SUSP: 1.0000 | Freq: Every day | NASAL | 5 refills | Status: DC

## 2022-11-07 NOTE — Assessment & Plan Note (Signed)
Flonase, astelin refilled.

## 2022-11-07 NOTE — Assessment & Plan Note (Signed)
Most consistent with hereditary androgenic alopecia.  Reviewed treatment options including over-the-counter minoxidil topically and low-dose finasteride (Propecia).  Discussed possible side effects of topical treatment including scalp irritation and worsening before improvement noted.  Discussed possible side effects of finasteride including sexual dysfunction, possible association with increased high-grade prostate cancer risk. Recommend start over-the-counter minoxidil daily, finasteride 1 mg daily sent to pharmacy, trial for the next several months and update with effect.

## 2022-11-07 NOTE — Assessment & Plan Note (Signed)
This is followed by endocrinology Dr. Sharl Ma.  He continues burning and Synthroid 175 mcg daily.

## 2022-11-07 NOTE — Assessment & Plan Note (Addendum)
Notes intermittent urinary pressure and hesitancy in the evenings.  Question beginnings of BPH.  Check urinalysis today.

## 2022-11-07 NOTE — Assessment & Plan Note (Signed)
Currently managing with over-the-counter supplements as per HPI.

## 2022-11-07 NOTE — Assessment & Plan Note (Signed)
Discussed meloxicam use

## 2022-11-19 ENCOUNTER — Ambulatory Visit: Payer: Medicaid Other | Admitting: Podiatry

## 2022-11-19 ENCOUNTER — Ambulatory Visit (INDEPENDENT_AMBULATORY_CARE_PROVIDER_SITE_OTHER): Payer: Medicaid Other

## 2022-11-19 DIAGNOSIS — M722 Plantar fascial fibromatosis: Secondary | ICD-10-CM

## 2022-11-19 MED ORDER — METHYLPREDNISOLONE 4 MG PO TBPK: ORAL_TABLET | ORAL | 0 refills | Status: DC

## 2022-11-19 NOTE — Progress Notes (Signed)
  Subjective:  Patient ID: Victor Zimmerman, male    DOB: 1985/10/30,  MRN: [id number]  Chief Complaint  Patient presents with   Foot Pain    C/o left arch pain and left heel pain especially on the mornings. Patient started to wear arch support inserts from [facility] and has cause a lot of pain to the arch.     37 y.o. male presents with the above complaint. Pt presents with heel pain worse in the mornings when he wakes up. Tried a high arch support which caused pain.    Review of Systems: Negative except as noted in the HPI. Denies N/V/F/Ch.   Objective:  There were no vitals filed for this visit. There is no height or weight on file to calculate BMI. Constitutional Well developed. Well nourished.  Vascular Dorsalis pedis pulses palpable bilaterally. Posterior tibial pulses palpable bilaterally. Capillary refill normal to all digits.  No cyanosis or clubbing noted. Pedal hair growth normal.  Neurologic Normal speech. Oriented to person, place, and time. Epicritic sensation to light touch grossly present bilaterally.  Dermatologic Nails well groomed and normal in appearance. No open wounds. No skin lesions.  Orthopedic: Normal joint ROM without pain or crepitus bilaterally. No visible deformities. Tender to palpation at the calcaneal tuber left. No pain with calcaneal squeeze left. Ankle ROM diminished range of motion left. Silfverskiold Test: negative left.   Radiographs: Taken and reviewed. No acute fractures or dislocations. No evidence of stress fracture.  Plantar heel spur absent. Posterior heel spur absent.   Assessment:   1. Plantar fasciitis of left foot    Plan:  Patient was evaluated and treated and all questions answered.  Plantar Fasciitis, left - XR reviewed as above.  - Educated on icing and stretching. Instructions given.  - Injection deferred per pt request - will proceed with methylpredinisolone dose pack 4 mg take as direced for 6 days - DME: Recommend  powerstep orthotics - pt already has - Pharmacologic management: Meloxicam 15 mg daily. Educated on risks/benefits and proper taking of medication.  Return in about 4 weeks (around 12/17/2022) for f/u L PF.

## 2022-11-21 ENCOUNTER — Ambulatory Visit: Payer: Medicaid Other | Admitting: Family Medicine

## 2022-12-25 ENCOUNTER — Ambulatory Visit (INDEPENDENT_AMBULATORY_CARE_PROVIDER_SITE_OTHER): Payer: Medicaid Other | Admitting: Podiatry

## 2022-12-25 DIAGNOSIS — M722 Plantar fascial fibromatosis: Secondary | ICD-10-CM

## 2022-12-25 NOTE — Progress Notes (Signed)
No show for apt.

## 2022-12-25 NOTE — Addendum Note (Signed)
Addended by: Carlena Hurl F on: 12/25/2022 04:06 PM   Modules accepted: Level of Service

## 2023-03-06 ENCOUNTER — Other Ambulatory Visit: Payer: Self-pay | Admitting: Family Medicine

## 2023-03-26 ENCOUNTER — Other Ambulatory Visit: Payer: Self-pay | Admitting: Family Medicine

## 2023-05-07 ENCOUNTER — Other Ambulatory Visit: Payer: Self-pay | Admitting: Family Medicine

## 2023-05-07 ENCOUNTER — Encounter: Payer: Self-pay | Admitting: Family Medicine

## 2023-05-07 DIAGNOSIS — E669 Obesity, unspecified: Secondary | ICD-10-CM

## 2023-05-07 DIAGNOSIS — J302 Other seasonal allergic rhinitis: Secondary | ICD-10-CM

## 2023-05-07 DIAGNOSIS — G8929 Other chronic pain: Secondary | ICD-10-CM

## 2023-05-07 DIAGNOSIS — K219 Gastro-esophageal reflux disease without esophagitis: Secondary | ICD-10-CM

## 2023-05-07 NOTE — Telephone Encounter (Signed)
 Meloxicam request fwd to Dr Reece Agar earlier today (see 05/07/23 refill note).

## 2023-05-07 NOTE — Telephone Encounter (Signed)
 Meloxicam Last OV:  11/06/22, med refill Next OV:  07/16/23, CPE

## 2023-05-09 MED ORDER — MELOXICAM 15 MG PO TABS: 15.0000 mg | ORAL_TABLET | Freq: Every day | ORAL | 0 refills | Status: DC

## 2023-05-09 MED ORDER — FLUTICASONE PROPIONATE 50 MCG/ACT NA SUSP: 1.0000 | Freq: Every day | NASAL | 5 refills | Status: DC

## 2023-05-09 MED ORDER — OMEPRAZOLE 40 MG PO CPDR: 40.0000 mg | DELAYED_RELEASE_CAPSULE | Freq: Every day | ORAL | 0 refills | Status: DC

## 2023-05-09 MED ORDER — BUPROPION HCL ER (XL) 150 MG PO TB24: 150.0000 mg | ORAL_TABLET | Freq: Every day | ORAL | 0 refills | Status: DC

## 2023-05-09 NOTE — Telephone Encounter (Signed)
ERx 

## 2023-06-06 ENCOUNTER — Other Ambulatory Visit: Payer: Self-pay | Admitting: Family Medicine

## 2023-06-06 DIAGNOSIS — G8929 Other chronic pain: Secondary | ICD-10-CM

## 2023-06-06 NOTE — Telephone Encounter (Signed)
Meloxicam Last OV:  11/06/22, med refill Next OV:  07/16/23, CPE

## 2023-07-02 ENCOUNTER — Ambulatory Visit (INDEPENDENT_AMBULATORY_CARE_PROVIDER_SITE_OTHER): Admitting: Family Medicine

## 2023-07-02 ENCOUNTER — Encounter: Payer: Self-pay | Admitting: Family Medicine

## 2023-07-02 VITALS — BP 122/72 | HR 58 | Temp 98.4°F | Ht 73.0 in | Wt 284.2 lb

## 2023-07-02 DIAGNOSIS — M25562 Pain in left knee: Secondary | ICD-10-CM

## 2023-07-02 DIAGNOSIS — J302 Other seasonal allergic rhinitis: Secondary | ICD-10-CM | POA: Diagnosis not present

## 2023-07-02 DIAGNOSIS — F900 Attention-deficit hyperactivity disorder, predominantly inattentive type: Secondary | ICD-10-CM

## 2023-07-02 DIAGNOSIS — J392 Other diseases of pharynx: Secondary | ICD-10-CM | POA: Insufficient documentation

## 2023-07-02 DIAGNOSIS — M25561 Pain in right knee: Secondary | ICD-10-CM | POA: Diagnosis not present

## 2023-07-02 DIAGNOSIS — G8929 Other chronic pain: Secondary | ICD-10-CM

## 2023-07-02 DIAGNOSIS — E669 Obesity, unspecified: Secondary | ICD-10-CM | POA: Diagnosis not present

## 2023-07-02 DIAGNOSIS — E063 Autoimmune thyroiditis: Secondary | ICD-10-CM

## 2023-07-02 DIAGNOSIS — E785 Hyperlipidemia, unspecified: Secondary | ICD-10-CM

## 2023-07-02 DIAGNOSIS — Z0000 Encounter for general adult medical examination without abnormal findings: Secondary | ICD-10-CM

## 2023-07-02 DIAGNOSIS — K219 Gastro-esophageal reflux disease without esophagitis: Secondary | ICD-10-CM

## 2023-07-02 DIAGNOSIS — Z23 Encounter for immunization: Secondary | ICD-10-CM | POA: Diagnosis not present

## 2023-07-02 LAB — COMPREHENSIVE METABOLIC PANEL
ALT: 15 U/L (ref 0–53)
AST: 17 U/L (ref 0–37)
Albumin: 4.8 g/dL (ref 3.5–5.2)
Alkaline Phosphatase: 43 U/L (ref 39–117)
BUN: 12 mg/dL (ref 6–23)
CO2: 29 meq/L (ref 19–32)
Calcium: 9.7 mg/dL (ref 8.4–10.5)
Chloride: 104 meq/L (ref 96–112)
Creatinine, Ser: 1.09 mg/dL (ref 0.40–1.50)
GFR: 86.25 mL/min (ref >60.00)
Glucose, Bld: 84 mg/dL (ref 70–99)
Potassium: 4.2 meq/L (ref 3.5–5.1)
Sodium: 140 meq/L (ref 135–145)
Total Bilirubin: 0.7 mg/dL (ref 0.2–1.2)
Total Protein: 7.4 g/dL (ref 6.0–8.3)

## 2023-07-02 LAB — LIPID PANEL
Cholesterol: 187 mg/dL (ref 0–200)
HDL: 37.2 mg/dL — ABNORMAL LOW (ref >39.00)
LDL Cholesterol: 118 mg/dL — ABNORMAL HIGH (ref 0–99)
NonHDL: 149.57
Total CHOL/HDL Ratio: 5
Triglycerides: 160 mg/dL — ABNORMAL HIGH (ref 0.0–149.0)
VLDL: 32 mg/dL (ref 0.0–40.0)

## 2023-07-02 LAB — TSH: TSH: 4.88 u[IU]/mL (ref 0.35–5.50)

## 2023-07-02 LAB — T4, FREE: Free T4: 0.65 ng/dL (ref 0.60–1.60)

## 2023-07-02 MED ORDER — BUPROPION HCL ER (XL) 150 MG PO TB24: 150.0000 mg | ORAL_TABLET | Freq: Every day | ORAL | 4 refills | Status: AC

## 2023-07-02 MED ORDER — NONFORMULARY OR COMPOUNDED ITEM: Status: AC

## 2023-07-02 MED ORDER — FLUTICASONE PROPIONATE 50 MCG/ACT NA SUSP: 1.0000 | Freq: Every day | NASAL | 11 refills | Status: AC

## 2023-07-02 MED ORDER — GLUCOSAMINE CHONDR 1500 COMPLX PO CAPS: 1.0000 | ORAL_CAPSULE | Freq: Every day | ORAL | Status: AC

## 2023-07-02 MED ORDER — OMEPRAZOLE 40 MG PO CPDR: 40.0000 mg | DELAYED_RELEASE_CAPSULE | Freq: Every day | ORAL | 4 refills | Status: AC

## 2023-07-02 MED ORDER — MELOXICAM 15 MG PO TABS: 15.0000 mg | ORAL_TABLET | Freq: Every day | ORAL | 3 refills | Status: DC

## 2023-07-02 NOTE — Assessment & Plan Note (Signed)
 Chronic off meds. Update FLP. The ASCVD Risk score (Arnett DK, et al., 2019) failed to calculate for the following reasons:   The 2019 ASCVD risk score is only valid for ages 3 to 87

## 2023-07-02 NOTE — Assessment & Plan Note (Addendum)
 Has seen endo Dr Winfield Cunas brand Synthroid ~6 months ago and has been treating with OTC Jacinto Reap supplement.  Will recheck TFTs.

## 2023-07-02 NOTE — Patient Instructions (Addendum)
 Tdap today Labs today  We will check thyroid levels - you may need to restart thyroid replacement medication/return to Dr Sharl Ma.  I will check test for nut allergy.  Return as needed or in 1 year for next physical.

## 2023-07-02 NOTE — Assessment & Plan Note (Signed)
 Continues omeprazole 40mg  daily.

## 2023-07-02 NOTE — Assessment & Plan Note (Signed)
 Notes swelling to throat after eating walnuts - will check mixed nut allergy panel.  Consider allergist evaluation.

## 2023-07-02 NOTE — Progress Notes (Unsigned)
 Ph: [phone] Fax: [phone]   Patient ID: Victor Zimmerman, male    DOB: 11-Oct-1985, 38 y.o.   MRN: [id number]  This visit was conducted in person.  BP 122/72   Pulse (!) 58   Temp 98.4 F (36.9 C) (Oral)   Ht 6\' 1"  (1.854 m)   Wt 284 lb 4 oz (128.9 kg)   SpO2 95%   BMI 37.50 kg/m    CC: CPE Subjective:   HPI: Victor Zimmerman is a 38 y.o. 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  Possible nut allergy - has noted throat swelling after certain shakes at chick fil'a and cookout. Similar symptoms to walnuts more recently. No tongue/lip swelling, wheezing, abd pain, nausea, dizziness, or hives. He notes he can eat peanut butter fine. Has not seen allergist.   ADHD - currently off stimulant. Takes vitamin supplements for this (mind and Memory Matrix multivitamin as well as ActiveMind supplement). Feels these are helpful.    On wellbutrin XL 150mg  daily for appetite suppression - desires repeat. Possible lightheadedness.    Hypothyroidism - saw endo Dr Sharl Ma diagnosed hashimoto thyroiditis was on Synthroid but was expensive. Stopped for 6 months - has been taking thyroid supplement Jacinto Reap with several vitamin  and L-Tyrosine.  Trouble tolerating levothyroxine - palpitations.   Chronic L knee pain managed with meloxicam 15mg  daily PRN. Will try turmeric.   Androgenic alopecia- started using red light cap with benefit.   Preventative: Flu shot - declined - bad reaction in the past COVID vaccine - declines for now  Tdap 2015, rpt today  Seat belt use discussed Sunscreen use discussed. No changing moles on skin.  Non smoker Alcohol - rare Dentist yearly  Eye exam Q2 yrs  Caffeine: 3-5 cups/day Lives with wife, 5 children Occupation: Arts administrator, owns business with office in [place]  Edu: HS  Activity: active at work Diet: good water, vegetables daily     Relevant past medical,  surgical, family and social history reviewed and updated as indicated. Interim medical history since our last visit reviewed. Allergies and medications reviewed and updated. Outpatient Medications Prior to Visit  Medication Sig Dispense Refill   acetaminophen (TYLENOL) 500 MG tablet Take 1,000 mg by mouth every 6 (six) hours as needed for pain.     aspirin-acetaminophen-caffeine (EXCEDRIN MIGRAINE) 250-250-65 MG tablet Take 2 tablets by mouth every 6 (six) hours as needed for headache.     azelastine (ASTELIN) 0.1 % nasal spray Place 1 spray into both nostrils 2 (two) times daily. Use in each nostril as directed 30 mL 11   Cholecalciferol (VITAMIN D3) 25 MCG (1000 UT) CAPS Take 1 capsule (1,000 Units total) by mouth daily. 30 capsule    Multiple Vitamin (MULTIVITAMIN ADULT PO) Take by mouth daily.     Multiple Vitamin (MULTIVITAMIN ADULT) TABS Take 1 tablet by mouth daily. Mind and Memory Matrix MVI     SYNTHROID 175 MCG tablet Take 1 tablet (175 mcg total) by mouth daily before breakfast.     buPROPion (WELLBUTRIN XL) 150 MG 24 hr tablet Take 1 tablet (150 mg total) by mouth daily. 90 tablet 0   fluticasone (FLONASE) 50 MCG/ACT nasal spray Place 1 spray into both nostrils daily. 18.2 mL 5   meloxicam (MOBIC) 15 MG tablet TAKE 1 TABLET (15 MG TOTAL) BY MOUTH DAILY. 30 tablet 0   omeprazole (PRILOSEC) 40 MG capsule Take 1  capsule (40 mg total) by mouth daily. 90 capsule 0   finasteride (PROPECIA) 1 MG tablet Take 1 tablet (1 mg total) by mouth daily. 30 tablet 3   methylPREDNISolone (MEDROL DOSEPAK) 4 MG TBPK tablet Take as directed for 6 days 1 each 0   No facility-administered medications prior to visit.     Per HPI unless specifically indicated in ROS section below Review of Systems  Constitutional:  Negative for activity change, appetite change, chills, fatigue, fever and unexpected weight change.  HENT:  Negative for hearing loss.   Eyes:  Negative for visual disturbance.  Respiratory:   Negative for cough, chest tightness, shortness of breath and wheezing.   Cardiovascular:  Negative for chest pain, palpitations and leg swelling.  Gastrointestinal:  Negative for abdominal distention, abdominal pain, blood in stool, constipation, diarrhea, nausea and vomiting.  Genitourinary:  Negative for difficulty urinating and hematuria.  Musculoskeletal:  Negative for arthralgias, myalgias and neck pain.  Skin:  Negative for rash.  Neurological:  Negative for dizziness, seizures, syncope and headaches.  Hematological:  Negative for adenopathy. Does not bruise/bleed easily.  Psychiatric/Behavioral:  Negative for dysphoric mood. The patient is not nervous/anxious.     Objective:  BP 122/72   Pulse (!) 58   Temp 98.4 F (36.9 C) (Oral)   Ht 6\' 1"  (1.854 m)   Wt 284 lb 4 oz (128.9 kg)   SpO2 95%   BMI 37.50 kg/m   Wt Readings from Last 3 Encounters:  07/02/23 284 lb 4 oz (128.9 kg)  11/06/22 292 lb 4 oz (132.6 kg)  06/28/22 280 lb (127 kg)      Physical Exam Vitals and nursing note reviewed.  Constitutional:      General: He is not in acute distress.    Appearance: Normal appearance. He is well-developed. He is not ill-appearing.  HENT:     Head: Normocephalic and atraumatic.     Right Ear: Hearing, tympanic membrane, ear canal and external ear normal.     Left Ear: Hearing, tympanic membrane, ear canal and external ear normal.     Nose: Nose normal.     Mouth/Throat:     Mouth: Mucous membranes are moist.     Pharynx: Oropharynx is clear. No oropharyngeal exudate or posterior oropharyngeal erythema.  Eyes:     General: No scleral icterus.    Extraocular Movements: Extraocular movements intact.     Conjunctiva/sclera: Conjunctivae normal.     Pupils: Pupils are equal, round, and reactive to light.  Neck:     Thyroid: No thyroid mass or thyromegaly.  Cardiovascular:     Rate and Rhythm: Normal rate and regular rhythm.     Pulses: Normal pulses.          Radial pulses  are 2+ on the right side and 2+ on the left side.     Heart sounds: Normal heart sounds. No murmur heard. Pulmonary:     Effort: Pulmonary effort is normal. No respiratory distress.     Breath sounds: Normal breath sounds. No wheezing, rhonchi or rales.  Abdominal:     General: Bowel sounds are normal. There is no distension.     Palpations: Abdomen is soft. There is no mass.     Tenderness: There is no abdominal tenderness. There is no guarding or rebound.     Hernia: No hernia is present.  Musculoskeletal:        General: Normal range of motion.     Cervical back: Normal  range of motion and neck supple.     Right lower leg: No edema.     Left lower leg: No edema.  Lymphadenopathy:     Cervical: No cervical adenopathy.  Skin:    General: Skin is warm and dry.     Findings: No rash.  Neurological:     General: No focal deficit present.     Mental Status: He is alert and oriented to person, place, and time.  Psychiatric:        Mood and Affect: Mood normal.        Behavior: Behavior normal.        Thought Content: Thought content normal.        Judgment: Judgment normal.       Results for orders placed or performed in visit on 11/06/22  POCT Urinalysis Dipstick (Automated)   Collection Time: 11/06/22  9:46 AM  Result Value Ref Range   Color, UA dark yellow    Clarity, UA clear    Glucose, UA Negative Negative   Bilirubin, UA negative    Ketones, UA negative    Spec Grav, UA 1.020 1.010 - 1.025   Blood, UA negative    pH, UA 6.0 5.0 - 8.0   Protein, UA Negative Negative   Urobilinogen, UA 0.2 0.2 or 1.0 E.U./dL   Nitrite, UA negative    Leukocytes, UA Negative Negative    Assessment & Plan:   Problem List Items Addressed This Visit     Health maintenance examination - Primary (Chronic)   Preventative protocols reviewed and updated unless pt declined. Discussed healthy diet and lifestyle.       Obesity (BMI 35.0-39.9 without comorbidity)   Continue to encourage  healthy diet and lifestyle choices to affect sustainable weight loss.       Relevant Medications   buPROPion (WELLBUTRIN XL) 150 MG 24 hr tablet   Seasonal allergic rhinitis   Relevant Medications   fluticasone (FLONASE) 50 MCG/ACT nasal spray   GERD (gastroesophageal reflux disease)   Relevant Medications   omeprazole (PRILOSEC) 40 MG capsule   Hypothyroidism due to Hashimoto's thyroiditis   Relevant Orders   TSH   T4, free   Dyslipidemia   Relevant Orders   Lipid panel   Comprehensive metabolic panel   Bilateral knee pain   Relevant Medications   meloxicam (MOBIC) 15 MG tablet   Swelling of throat   After eating walnuts - will check mixed nut allergy panel       Relevant Orders   Allergy Panel 18, [employer]   Other Visit Diagnoses       Need for Tdap vaccination       Relevant Orders   Tdap vaccine greater than or equal to 7yo IM (Completed)        Meds ordered this encounter  Medications   buPROPion (WELLBUTRIN XL) 150 MG 24 hr tablet    Sig: Take 1 tablet (150 mg total) by mouth daily.    Dispense:  90 tablet    Refill:  4   fluticasone (FLONASE) 50 MCG/ACT nasal spray    Sig: Place 1 spray into both nostrils daily.    Dispense:  18.2 mL    Refill:  11   meloxicam (MOBIC) 15 MG tablet    Sig: Take 1 tablet (15 mg total) by mouth daily.    Dispense:  30 tablet    Refill:  3   omeprazole (PRILOSEC) 40 MG capsule  Sig: Take 1 capsule (40 mg total) by mouth daily.    Dispense:  90 capsule    Refill:  4   NONFORMULARY OR COMPOUNDED ITEM    Sig: Active Mind supplement with caffeine and L-theanine   Glucosamine-Chondroit-Vit C-Mn (GLUCOSAMINE CHONDR 1500 COMPLX) CAPS    Sig: Take 1 capsule by mouth daily.    Orders Placed This Encounter  Procedures   Tdap vaccine greater than or equal to 7yo IM   Allergy Panel 18, [employer]   Lipid panel   Comprehensive metabolic panel   TSH   T4, free    Patient Instructions  Tdap today Labs today  We  will check thyroid levels - you may need to restart thyroid replacement medication/return to Dr Sharl Ma.  I will check test for nut allergy.  Return as needed or in 1 year for next physical.   Follow up plan: Return in about 1 year (around 07/01/2024) for annual exam, prior fasting for blood work.  [name], MD

## 2023-07-02 NOTE — Assessment & Plan Note (Signed)
 Preventative protocols reviewed and updated unless pt declined. Discussed healthy diet and lifestyle.

## 2023-07-02 NOTE — Assessment & Plan Note (Addendum)
 Continue to encourage healthy diet and lifestyle choices to affect sustainable weight loss.  Wellbutrin restarted.

## 2023-07-03 ENCOUNTER — Encounter: Payer: Self-pay | Admitting: Family Medicine

## 2023-07-03 LAB — ALLERGY PANEL 18, NUT MIX GROUP
Almonds: <0.1 kU/L
CLASS: 0
CLASS: 0
CLASS: 0
CLASS: 0
CLASS: 0
CLASS: 0
Cashew IgE: <0.1 kU/L
Class: 0
Coconut: <0.1 kU/L
Hazelnut: <0.1 kU/L
Peanut IgE: <0.1 kU/L
Pecan Nut: <0.1 kU/L
Sesame Seed f10: <0.1 kU/L

## 2023-07-03 LAB — INTERPRETATION:

## 2023-07-03 NOTE — Assessment & Plan Note (Signed)
 Continue flonase,astelin PRN

## 2023-07-03 NOTE — Assessment & Plan Note (Signed)
 Not currently on stimulant. Continues OTC supplement Suggested restarting wellbutrin.

## 2023-07-03 NOTE — Assessment & Plan Note (Addendum)
 L>R.  Manages with meloxicam and glucosamine/chondroitin.  Suggested trial turmeric.

## 2023-07-05 ENCOUNTER — Encounter: Payer: Self-pay | Admitting: Family Medicine

## 2023-07-16 ENCOUNTER — Encounter: Payer: Managed Care, Other (non HMO) | Admitting: Family Medicine

## 2023-11-21 ENCOUNTER — Other Ambulatory Visit: Payer: Self-pay

## 2023-11-21 DIAGNOSIS — J302 Other seasonal allergic rhinitis: Secondary | ICD-10-CM

## 2023-11-21 MED ORDER — AZELASTINE HCL 0.1 % NA SOLN: 1.0000 | Freq: Two times a day (BID) | NASAL | 11 refills | Status: AC

## 2023-12-25 ENCOUNTER — Other Ambulatory Visit: Payer: Self-pay | Admitting: Family Medicine

## 2023-12-25 DIAGNOSIS — K219 Gastro-esophageal reflux disease without esophagitis: Secondary | ICD-10-CM

## 2024-02-09 ENCOUNTER — Other Ambulatory Visit: Payer: Self-pay | Admitting: Family Medicine

## 2024-02-09 DIAGNOSIS — G8929 Other chronic pain: Secondary | ICD-10-CM

## 2024-02-10 NOTE — Telephone Encounter (Signed)
 ERx

## 2024-04-21 ENCOUNTER — Encounter: Payer: Self-pay | Admitting: Family Medicine

## 2024-04-21 ENCOUNTER — Ambulatory Visit (INDEPENDENT_AMBULATORY_CARE_PROVIDER_SITE_OTHER): Admitting: Family Medicine

## 2024-04-21 VITALS — BP 124/80 | HR 65 | Temp 97.8°F | Ht 73.0 in | Wt 307.0 lb

## 2024-04-21 DIAGNOSIS — R5383 Other fatigue: Secondary | ICD-10-CM | POA: Insufficient documentation

## 2024-04-21 DIAGNOSIS — E063 Autoimmune thyroiditis: Secondary | ICD-10-CM

## 2024-04-21 DIAGNOSIS — N529 Male erectile dysfunction, unspecified: Secondary | ICD-10-CM | POA: Diagnosis not present

## 2024-04-21 LAB — BASIC METABOLIC PANEL WITH GFR
BUN: 16 mg/dL (ref 6–23)
CO2: 32 meq/L (ref 19–32)
Calcium: 9.2 mg/dL (ref 8.4–10.5)
Chloride: 103 meq/L (ref 96–112)
Creatinine, Ser: 1 mg/dL (ref 0.40–1.50)
GFR: 95.11 mL/min
Glucose, Bld: 96 mg/dL (ref 70–99)
Potassium: 4 meq/L (ref 3.5–5.1)
Sodium: 141 meq/L (ref 135–145)

## 2024-04-21 LAB — T4, FREE: Free T4: 0.76 ng/dL (ref 0.60–1.60)

## 2024-04-21 LAB — TESTOSTERONE: Testosterone: 290.53 ng/dL — ABNORMAL LOW (ref 300.00–890.00)

## 2024-04-21 LAB — TSH: TSH: 11.2 u[IU]/mL — ABNORMAL HIGH (ref 0.35–5.50)

## 2024-04-21 NOTE — Assessment & Plan Note (Addendum)
 New - check T levels along with thyroid  function.  Recommend against OTC testosterone booster/supplement due to concern over safety of these.  Check BMP as well r/o hyperglycemia contribution.

## 2024-04-21 NOTE — Patient Instructions (Signed)
 Labs today including testosterone and thyroid  function testing  We will be in touch with results.

## 2024-04-21 NOTE — Assessment & Plan Note (Addendum)
 Newly noted.  Check BMP and thyroid  function.  No chest pain.  Encouraged weight loss, consider trial viagra pending results.

## 2024-04-21 NOTE — Progress Notes (Signed)
 " Ph: [phone] Fax: [phone]   Patient ID: Victor Zimmerman, male    DOB: May 01, 1985, 38 y.o.   MRN: [id number]  This visit was conducted in person.  BP 124/80 (Cuff Size: Large)   Pulse 65   Temp 97.8 F (36.6 C) (Oral)   Ht 6' 1 (1.854 m)   Wt (!) 307 lb (139.3 kg)   SpO2 96%   BMI 40.50 kg/m    CC: discuss testosterone  Subjective:   HPI: Victor Zimmerman is a 38 y.o. 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   Hypothyroid previously saw Dr Faythe for hashimoto thyroiditis was on Synthroid  150-1104mcg daily became unaffordable. Trouble tolerating levothyroxine . Has been taking Throne thyroid  supplement as well as L-tyrosine -  Notes decreased energy No skin or hair changes No constipation No cold /heat intolerance  Notes increased irritability at work.   Notes low energy - drinks 4-5 cups of coffee during the day when at work.  Daytime somnolence.  Possibly nonrestorative sleep.  Increased snoring weight weight gain.  No witnessed apnea, no PNDyspnea, no morning headaches.  ESS = 5 No depressed mood Ok sex drive.  Notes some erectile dysfunction over the past 3 months - both trouble obtaining and maintaining.  No testicular masses.  Would like testosterone levels checked.  He's been taking OTC testosterone booster ([employer] peak performance) without much benefit.      Relevant past medical, surgical, family and social history reviewed and updated as indicated. Interim medical history since our last visit reviewed. Allergies and medications reviewed and updated. Outpatient Medications Prior to Visit  Medication Sig Dispense Refill   acetaminophen  (TYLENOL ) 500 MG tablet Take 1,000 mg by mouth every 6  (six) hours as needed for pain.     aspirin-acetaminophen -caffeine (EXCEDRIN MIGRAINE) 250-250-65 MG tablet Take 2 tablets by mouth every 6 (six) hours as needed for headache.     azelastine  (ASTELIN ) 0.1 % nasal spray Place 1 spray into both nostrils 2 (two) times daily. Use in each nostril as directed (Patient taking differently: Place 1 spray into both nostrils as needed for allergies. Use in each nostril as directed) 30 mL 11   buPROPion  (WELLBUTRIN  XL) 150 MG 24 hr tablet Take 1 tablet (150 mg total) by mouth daily. 90 tablet 4   Cholecalciferol (VITAMIN D3) 25 MCG (1000 UT) CAPS Take 1 capsule (1,000 Units total) by mouth daily. 30 capsule    fluticasone  (FLONASE ) 50 MCG/ACT nasal spray Place 1 spray into both nostrils daily. (Patient taking differently: Place 1 spray into both nostrils as needed.) 18.2 mL 11   meloxicam  (MOBIC ) 15 MG tablet TAKE 1 TABLET BY MOUTH EVERY DAY 30 tablet 2   Multiple Vitamin (MULTIVITAMIN ADULT PO) Take by mouth daily.     Multiple Vitamin (MULTIVITAMIN ADULT) TABS Take 1 tablet by mouth daily. Mind and Memory Matrix MVI     NONFORMULARY OR COMPOUNDED ITEM Active Mind supplement with caffeine and L-theanine     omeprazole  (PRILOSEC) 40 MG capsule Take 1 capsule (40 mg total) by mouth daily. 90 capsule 4   Glucosamine-Chondroit-Vit C-Mn (GLUCOSAMINE CHONDR 1500 COMPLX) CAPS Take 1 capsule by mouth daily.     SYNTHROID  175 MCG tablet Take 1 tablet (175 mcg total) by  mouth daily before breakfast. (Patient not taking: Reported on 04/21/2024)     No facility-administered medications prior to visit.     Per HPI unless specifically indicated in ROS section below Review of Systems  Objective:  BP 124/80 (Cuff Size: Large)   Pulse 65   Temp 97.8 F (36.6 C) (Oral)   Ht 6' 1 (1.854 m)   Wt (!) 307 lb (139.3 kg)   SpO2 96%   BMI 40.50 kg/m   Wt Readings from Last 3 Encounters:  04/21/24 (!) 307 lb (139.3 kg)  07/02/23 284 lb 4 oz (128.9 kg)  11/06/22 292 lb  4 oz (132.6 kg)      Physical Exam Vitals and nursing note reviewed.  Constitutional:      Appearance: Normal appearance. He is not ill-appearing.  HENT:     Head: Normocephalic and atraumatic.     Mouth/Throat:     Mouth: Mucous membranes are moist.     Pharynx: Oropharynx is clear. No oropharyngeal exudate or posterior oropharyngeal erythema.  Eyes:     Extraocular Movements: Extraocular movements intact.     Pupils: Pupils are equal, round, and reactive to light.  Neck:     Thyroid : No thyroid  mass or thyromegaly.  Cardiovascular:     Rate and Rhythm: Normal rate and regular rhythm.     Pulses: Normal pulses.     Heart sounds: Normal heart sounds. No murmur heard. Pulmonary:     Effort: Pulmonary effort is normal. No respiratory distress.     Breath sounds: Normal breath sounds. No wheezing, rhonchi or rales.  Musculoskeletal:     Cervical back: Normal range of motion and neck supple. No tenderness.     Right lower leg: No edema.     Left lower leg: No edema.  Skin:    General: Skin is warm and dry.     Findings: No rash.  Neurological:     Mental Status: He is alert.  Psychiatric:        Mood and Affect: Mood normal.       Lab Results  Component Value Date   TSH 4.88 07/02/2023   T3TOTAL 93 10/21/2020   FREET4 0.65 07/02/2023   Assessment & Plan:   Problem List Items Addressed This Visit     Obesity, morbid, BMI 40.0-49.9 (HCC)   Weight gain noted.  He continues wellbutrin .  Encouraged renewed efforts towards healthy diet and lifestyle choices to affect sustainable weight loss.       Relevant Orders   Basic metabolic panel with GFR   Hypothyroidism due to Hashimoto's thyroiditis   Previously saw endo Cherylynn) Levothroxine intolerance - palpitations Tolerated Synthroid  better but became unaffordable.  He may be interested in restarting thyroid  supplementation.  Update TFTs       Relevant Orders   TSH   T3   T4, free   Fatigue - Primary   New -  check T levels along with thyroid  function.  Recommend against OTC testosterone booster/supplement due to concern over safety of these.  Check BMP as well r/o hyperglycemia contribution.       Relevant Orders   Testosterone   Basic metabolic panel with GFR   Erectile dysfunction   Newly noted.  Check BMP and thyroid  function.  No chest pain.  Encouraged weight loss, consider trial viagra pending results.         No orders of the defined types were placed in this encounter.   Orders Placed This Encounter  Procedures   TSH   T3   T4, free   Testosterone   Basic metabolic panel with GFR    Patient Instructions  Labs today including testosterone and thyroid  function testing  We will be in touch with results.   Follow up plan: Return if symptoms worsen or fail to improve.  Anton Blas, MD   "

## 2024-04-21 NOTE — Assessment & Plan Note (Signed)
 Previously saw endo Cherylynn) Levothroxine intolerance - palpitations Tolerated Synthroid  better but became unaffordable.  He may be interested in restarting thyroid  supplementation.  Update TFTs

## 2024-04-21 NOTE — Assessment & Plan Note (Signed)
 Weight gain noted.  He continues wellbutrin .  Encouraged renewed efforts towards healthy diet and lifestyle choices to affect sustainable weight loss.

## 2024-04-22 ENCOUNTER — Ambulatory Visit: Payer: Self-pay | Admitting: Family Medicine

## 2024-04-22 DIAGNOSIS — R7989 Other specified abnormal findings of blood chemistry: Secondary | ICD-10-CM

## 2024-04-22 DIAGNOSIS — E063 Autoimmune thyroiditis: Secondary | ICD-10-CM

## 2024-04-22 LAB — T3: T3, Total: 124 ng/dL (ref 76–181)

## 2024-04-22 MED ORDER — SYNTHROID 150 MCG PO TABS: 150.0000 ug | ORAL_TABLET | Freq: Every day | ORAL | 1 refills | Status: DC

## 2024-04-22 MED ORDER — SYNTHROID 175 MCG PO TABS: 175.0000 ug | ORAL_TABLET | Freq: Every day | ORAL | 1 refills | Status: AC

## 2024-05-19 ENCOUNTER — Telehealth: Admitting: Emergency Medicine

## 2024-05-19 DIAGNOSIS — R059 Cough, unspecified: Secondary | ICD-10-CM

## 2024-05-19 NOTE — Progress Notes (Signed)
 I verified pt name and DOB. He was treated last week for sinus infection and is getting worse, now has chest sx. I advised I cannot help by virutal care and recommended he seek care at an urgent care. Pt agreed.   Jon Belt, PhD, FNP-BC [facility] Phone: [phone] 05/19/2024 4:54 PM

## 2024-05-21 ENCOUNTER — Other Ambulatory Visit: Payer: Self-pay | Admitting: Family Medicine

## 2024-05-21 DIAGNOSIS — G8929 Other chronic pain: Secondary | ICD-10-CM

## 2024-05-21 NOTE — Telephone Encounter (Signed)
 Meloxicam  Last filled:  04/23/24, #30 Last OV:  04/21/24, low testosterone  Next OV:  05/26/24, thyroid /testosterone  f/u

## 2024-05-26 ENCOUNTER — Ambulatory Visit: Admitting: Family Medicine

## 2024-06-15 ENCOUNTER — Ambulatory Visit: Admitting: Family Medicine

## 2024-07-08 ENCOUNTER — Encounter: Admitting: Family Medicine
